# Patient Record
Sex: Female | Born: 1955 | Race: White | Hispanic: No | Marital: Married | Smoking: Never smoker
Health system: Southern US, Community
[De-identification: ages and names within clinical notes are randomized; demographics above are authoritative.]

## PROBLEM LIST (undated history)

## (undated) DIAGNOSIS — G5760 Lesion of plantar nerve, unspecified lower limb: Secondary | ICD-10-CM

## (undated) DIAGNOSIS — N840 Polyp of corpus uteri: Secondary | ICD-10-CM

## (undated) DIAGNOSIS — M8580 Other specified disorders of bone density and structure, unspecified site: Secondary | ICD-10-CM

## (undated) DIAGNOSIS — R51 Headache: Secondary | ICD-10-CM

## (undated) HISTORY — DX: Polyp of corpus uteri: N84.0

## (undated) HISTORY — DX: Headache: R51

## (undated) HISTORY — DX: Lesion of plantar nerve, unspecified lower limb: G57.60

## (undated) HISTORY — DX: Other specified disorders of bone density and structure, unspecified site: M85.80

## (undated) HISTORY — PX: HYSTEROSCOPY: SHX211

---
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
## 1999-04-12 HISTORY — PX: FOOT NEUROMA SURGERY: SHX646

## 1999-11-02 ENCOUNTER — Ambulatory Visit (HOSPITAL_BASED_OUTPATIENT_CLINIC_OR_DEPARTMENT_OTHER): Admission: RE | Admit: 1999-11-02 | Discharge: 1999-11-02 | Payer: Self-pay | Admitting: Orthopaedic Surgery

## 1999-11-02 ENCOUNTER — Encounter (INDEPENDENT_AMBULATORY_CARE_PROVIDER_SITE_OTHER): Payer: Self-pay | Admitting: *Deleted

## 2000-03-01 ENCOUNTER — Other Ambulatory Visit: Admission: RE | Admit: 2000-03-01 | Discharge: 2000-03-01 | Payer: Self-pay | Admitting: *Deleted

## 2001-05-08 ENCOUNTER — Encounter: Payer: Self-pay | Admitting: Family Medicine

## 2001-05-08 ENCOUNTER — Encounter: Admission: RE | Admit: 2001-05-08 | Discharge: 2001-05-08 | Payer: Self-pay | Admitting: Family Medicine

## 2001-11-07 ENCOUNTER — Other Ambulatory Visit: Admission: RE | Admit: 2001-11-07 | Discharge: 2001-11-07 | Payer: Self-pay | Admitting: *Deleted

## 2003-05-21 ENCOUNTER — Encounter: Admission: RE | Admit: 2003-05-21 | Discharge: 2003-05-21 | Payer: Self-pay | Admitting: *Deleted

## 2004-04-16 ENCOUNTER — Other Ambulatory Visit: Admission: RE | Admit: 2004-04-16 | Discharge: 2004-04-16 | Payer: Self-pay | Admitting: *Deleted

## 2004-06-28 ENCOUNTER — Ambulatory Visit (HOSPITAL_COMMUNITY): Admission: RE | Admit: 2004-06-28 | Discharge: 2004-06-28 | Payer: Self-pay | Admitting: Gynecology

## 2004-06-28 ENCOUNTER — Encounter (INDEPENDENT_AMBULATORY_CARE_PROVIDER_SITE_OTHER): Payer: Self-pay | Admitting: *Deleted

## 2004-06-28 ENCOUNTER — Ambulatory Visit (HOSPITAL_BASED_OUTPATIENT_CLINIC_OR_DEPARTMENT_OTHER): Admission: RE | Admit: 2004-06-28 | Discharge: 2004-06-28 | Payer: Self-pay | Admitting: Gynecology

## 2004-09-13 ENCOUNTER — Ambulatory Visit (HOSPITAL_COMMUNITY): Admission: RE | Admit: 2004-09-13 | Discharge: 2004-09-13 | Payer: Self-pay | Admitting: Endocrinology

## 2004-09-13 IMAGING — US US SOFT TISSUE HEAD/NECK
1 series · 14 of 25 positions shown · non-contrast
Comparison: none

CLINICAL DATA: 49 year old; right neck lesion.
 THYROID ULTRASOUND ? [DATE]: 
 The right lobe measures 5.0 x 2.0 x 2.2 cm.  The left lobe measures 5.3 x 1.9 x 2.0 cm.  The isthmus measures 0.8 cm.  The right thyroid lobe has two lesions and the left thyroid lobe also has two lesions.  These are all fairly similar in size and appearance.  None of these is significantly greater than a centimeter and I think these can be followed with ultrasound.  There is a palpable right submandibular lymph nodes which measures 0.9 x 0.7 x 1.3 cm.  No other enlarged neck nodes are visualized.

[Series 1: unknown · 0.09mm/px · 14 of 35 slices shown]
[im 1/35]
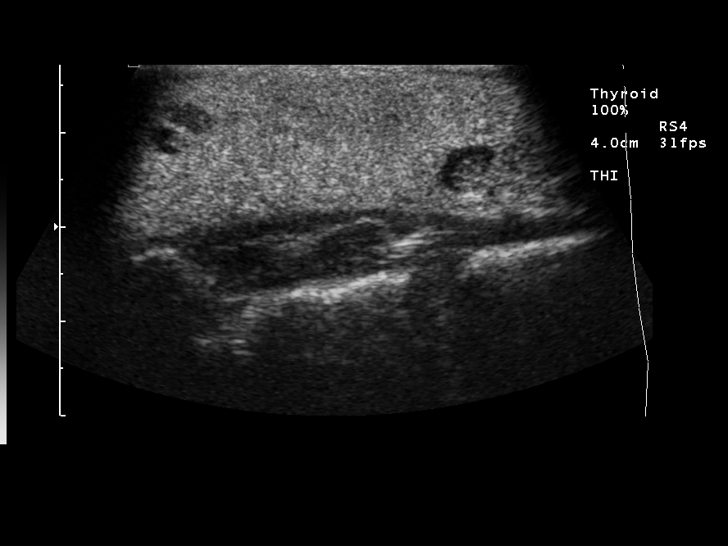
[im 3/35]
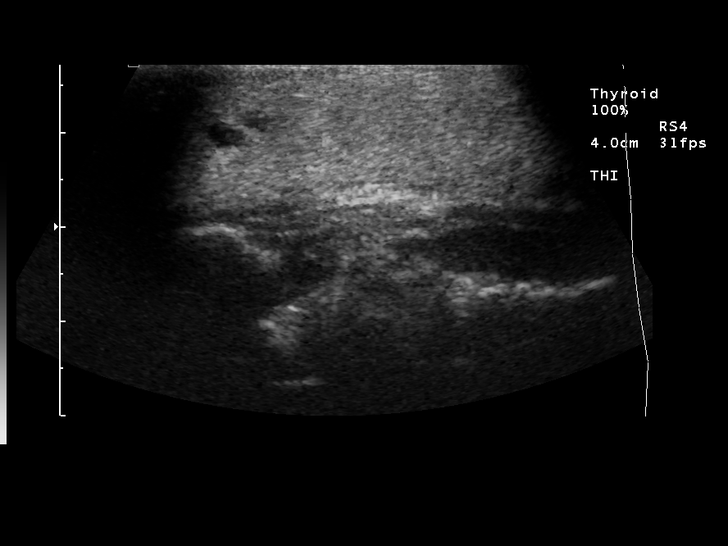
[im 6/35]
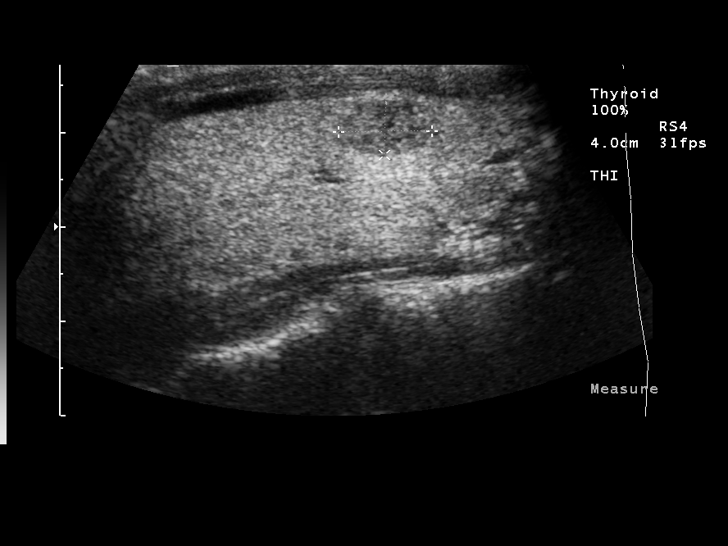
[im 9/35]
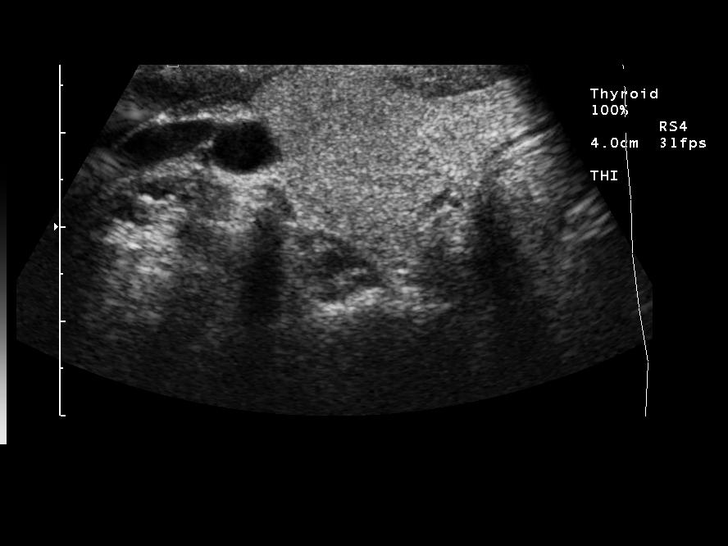
[im 12/35]
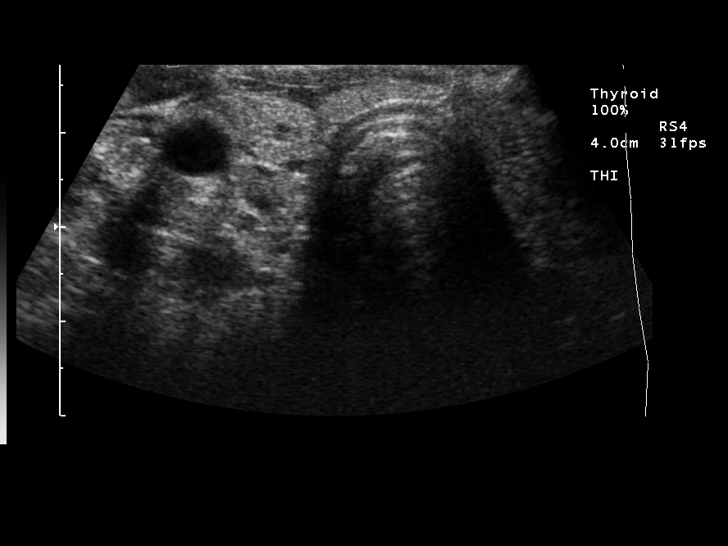
[im 13/35]
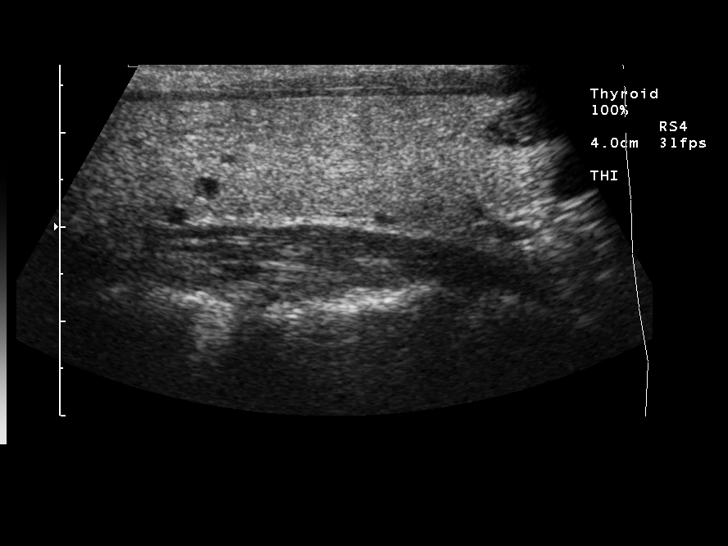
[im 16/35]
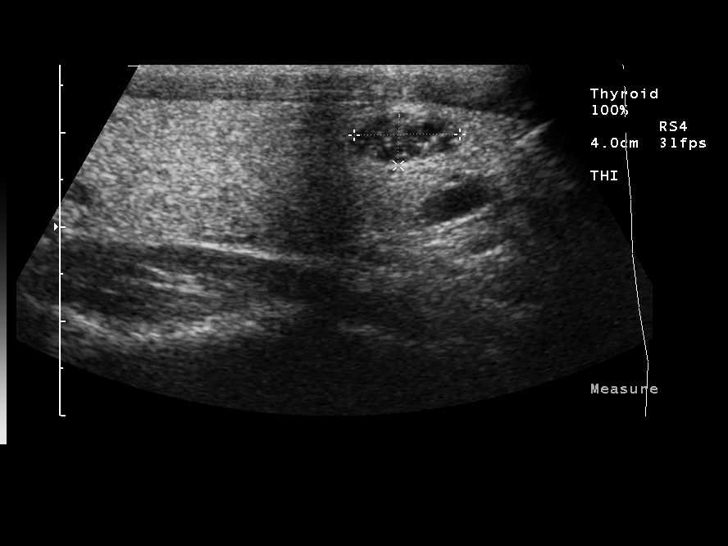
[im 19/35]
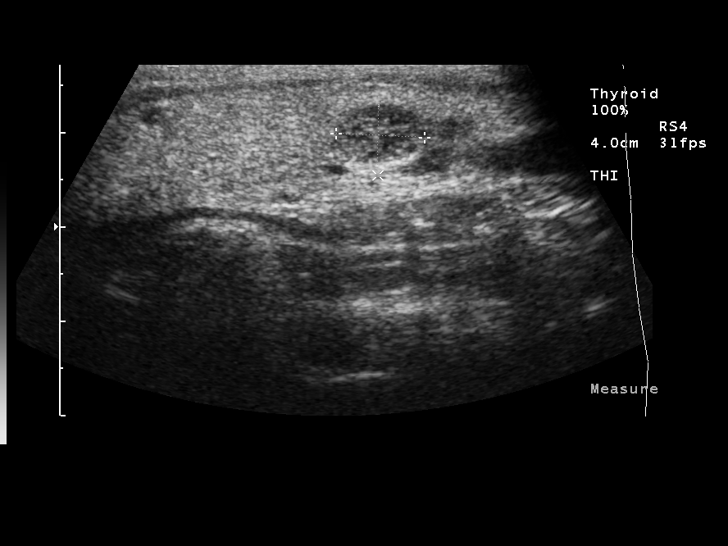
[im 22/35]
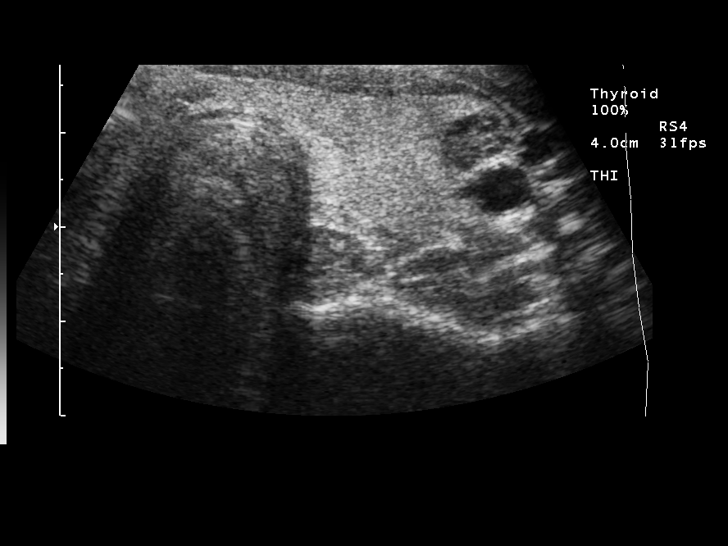
[im 23/35]
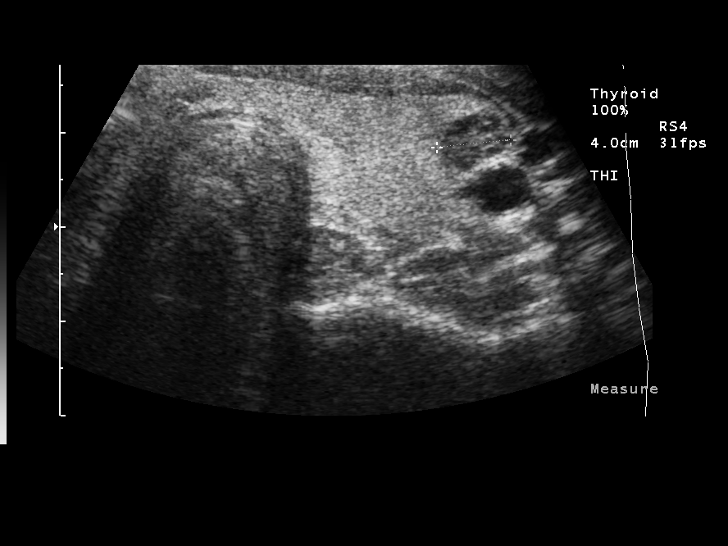
[im 26/35]
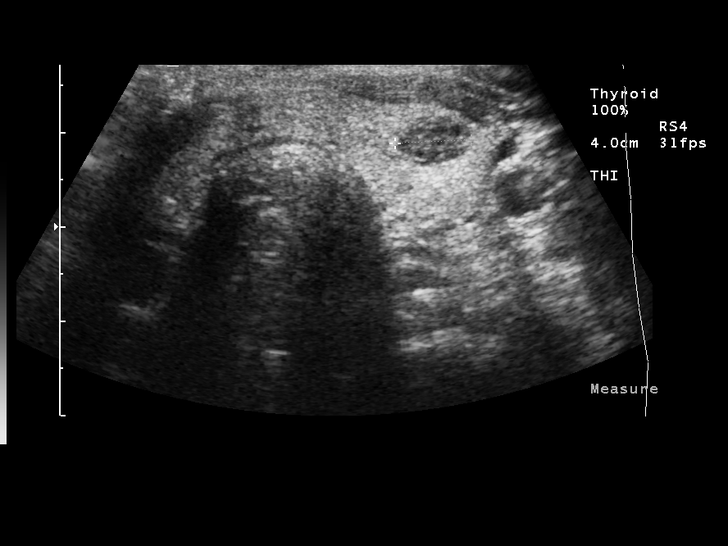
[im 29/35]
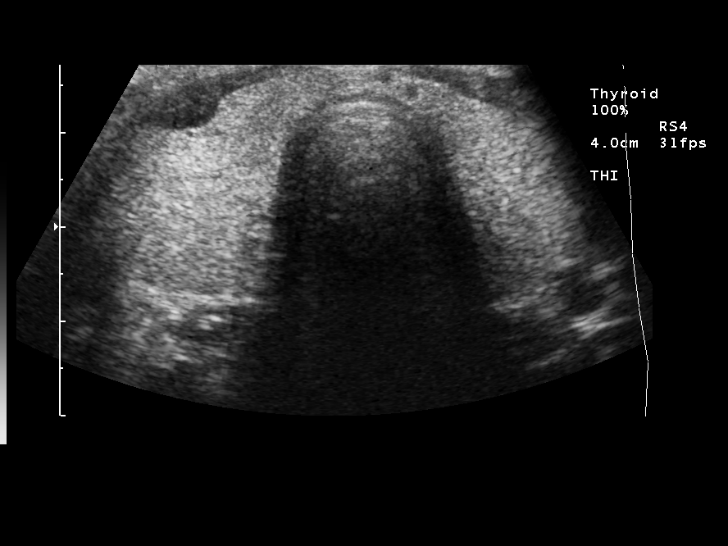
[im 32/35]
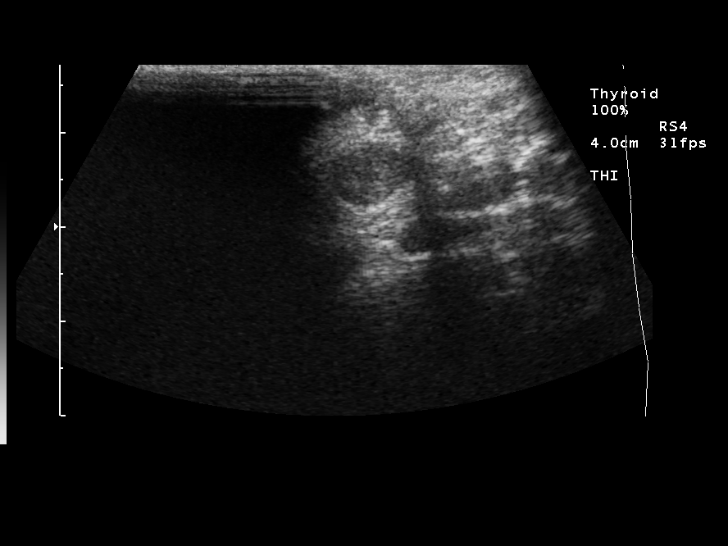
[im 35/35]
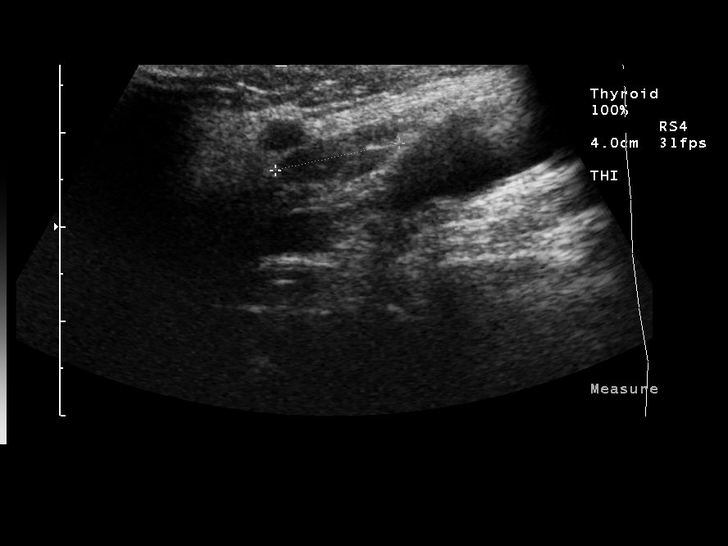

[14 of 25 positions shown; findings below may reference images not displayed]

IMPRESSION: 1.  Slight enlargement of the thyroid gland with somewhat heterogeneous echotexture.  There are four small thyroid lesions which can be followed with ultrasound in six months.
 2.  Right submandibular lymph node measuring 0.9 x 0.7 x 1.3 cm.

## 2004-11-15 ENCOUNTER — Encounter: Admission: RE | Admit: 2004-11-15 | Discharge: 2004-11-15 | Payer: Self-pay | Admitting: *Deleted

## 2004-12-14 ENCOUNTER — Encounter: Admission: RE | Admit: 2004-12-14 | Discharge: 2004-12-14 | Payer: Self-pay | Admitting: Nurse Practitioner

## 2005-06-06 ENCOUNTER — Other Ambulatory Visit: Admission: RE | Admit: 2005-06-06 | Discharge: 2005-06-06 | Payer: Self-pay | Admitting: Gynecology

## 2005-11-14 ENCOUNTER — Ambulatory Visit: Payer: Self-pay | Admitting: Gastroenterology

## 2006-01-04 ENCOUNTER — Ambulatory Visit: Payer: Self-pay | Admitting: Gastroenterology

## 2007-10-04 ENCOUNTER — Encounter: Admission: RE | Admit: 2007-10-04 | Discharge: 2007-10-04 | Payer: Self-pay | Admitting: Unknown Physician Specialty

## 2007-11-09 ENCOUNTER — Ambulatory Visit (HOSPITAL_BASED_OUTPATIENT_CLINIC_OR_DEPARTMENT_OTHER): Admission: RE | Admit: 2007-11-09 | Discharge: 2007-11-09 | Payer: Self-pay | Admitting: Gynecology

## 2007-12-11 ENCOUNTER — Encounter: Payer: Self-pay | Admitting: Gynecology

## 2007-12-11 ENCOUNTER — Ambulatory Visit: Payer: Self-pay | Admitting: Gynecology

## 2007-12-11 ENCOUNTER — Other Ambulatory Visit: Admission: RE | Admit: 2007-12-11 | Discharge: 2007-12-11 | Payer: Self-pay | Admitting: Gynecology

## 2010-05-05 ENCOUNTER — Other Ambulatory Visit: Payer: Self-pay | Admitting: Gynecology

## 2010-05-05 DIAGNOSIS — Z1239 Encounter for other screening for malignant neoplasm of breast: Secondary | ICD-10-CM

## 2010-05-18 ENCOUNTER — Ambulatory Visit
Admission: RE | Admit: 2010-05-18 | Discharge: 2010-05-18 | Disposition: A | Discharge status: Home / Self Care | Payer: BC Managed Care – PPO | Source: Ambulatory Visit | Attending: Gynecology | Admitting: Gynecology

## 2010-05-18 DIAGNOSIS — Z1239 Encounter for other screening for malignant neoplasm of breast: Secondary | ICD-10-CM

## 2010-08-24 NOTE — Op Note (Signed)
NAMEANNA-MARIE, COLLER               ACCOUNT NO.:  [id number]   MEDICAL RECORD NO.:  [id number]          PATIENT TYPE:  AMB   LOCATION:  [facility]                         FACILITY:  [facility]   PHYSICIAN:  Timothy P. Fontaine, M.D.DATE OF BIRTH:  11-Nov-1955   DATE OF PROCEDURE:  11/09/2007  DATE OF DISCHARGE:                               OPERATIVE REPORT   PREOPERATIVE DIAGNOSES:  Perimenopausal bleeding, endometrial polyp.   POSTOPERATIVE DIAGNOSES:  Perimenopausal bleeding, endometrial polyp.   PROCEDURE:  Hysteroscopy, D&C, removal of endometrial polyps.   SURGEON:  Timothy P. Fontaine, M.D.   ANESTHETIC:  General, with 1% lidocaine paracervical block.   ESTIMATED BLOOD LOSS:  Minimal.   SORBITOL DISCREPANCY:  Minimal.   COMPLICATIONS:  None.   SPECIMENS:  1. Endometrial curetting  2. Questionable endometrial polyp.   FINDINGS:  EUA:  External BUS and vagina normal.  Cervix normal.  Uterus  normal size, midline and mobile.  Adnexa without masses.  Hysteroscopic  with multiple polypoidal fragments filling the cavity.  Post D&C, cavity  noted to be empty, good distention, adequate hysteroscopy, noting fundus  anterior and posterior surfaces, lower uterine segment, endocervical  canal, right and left tubal ostia all visualized.   PROCEDURE:  The patient was taken to the operating room, underwent  general anesthesia, and was placed in the low dorsal lithotomy position.  Received a perineal vaginal preparation with Betadine solution.  Bladder  emptied with in-and-out Foley catheterization.  EUA performed, and the  patient was draped in the usual fashion.  Cervix was visualized with a  speculum.  Anterior lip grasped with a single-tooth tenaculum, and a  paracervical block using 1% lidocaine was placed, a total of 9 mL.  The  cervix was gently dilated during this process.  With removal of the  dilator, a polypoid old mucoid mass extruded from the cervix, and this  was sent as a  questionable polyp, although could represent a clot.  Subsequently, hysteroscopy was performed, with findings of multiple  polypoidal hyperplastic-type pattern filling the cavity.  A sharp  curettage was performed, with abundant return, and re-hysteroscopy  showed an empty cavity.  No remaining fragments.  There was good  distention, no evidence of  perforation.  Instruments were removed.  Adequate hemostasis visualized.  The patient received Toradol intraoperatively and was placed in the  supine position, awakened without difficulty, and was taken to the  recovery room in good condition, having tolerated the procedure well.      Timothy P. Fontaine, M.D.  Electronically Signed     TPF/MEDQ  D:  11/09/2007  T:  11/09/2007  Job:  [id number]

## 2010-08-24 NOTE — H&P (Signed)
NAME:  Emily Mcknight, Emily Mcknight               ACCOUNT NO.:  [id number]   MEDICAL RECORD NO.:  [id number]         PATIENT TYPE:  HAMB   LOCATION:                               FACILITY:  [facility]   PHYSICIAN:  Timothy P. Fontaine, M.D.DATE OF BIRTH:  1955/12/09   DATE OF ADMISSION:  11/09/2007  DATE OF DISCHARGE:                              HISTORY & PHYSICAL   CHIEF COMPLAINT:  Perimenopausal bleeding, endometrial polyp.   HISTORY OF PRESENT ILLNESS:  A 55 year old G2, P2 female, vasectomy  birth control, with a history of irregular bleeding since the beginning  of this year.  The patient does have past history of endometrial polyp  status post hysteroscopy, polypectomy in 2006 and her evaluation  currently included sonohysterogram which showed an endometrial polyp  measuring 22 x 27 x 10 mm along the anterior endometrial cavity wall.  The patient is admitted at this time for hysteroscopy, polypectomy, D  and C.   PAST MEDICAL HISTORY:  Headaches.   PAST SURGICAL HISTORY:  Hysteroscopy, D and C, thyroid cyst removal,  foot neuroma x2 excision.   CURRENT MEDICATIONS:  1. Topamax.  2. Relpax p.r.n. for headaches.   ALLERGIES:  No medications.   REVIEW OF SYSTEMS:  Noncontributory.   FAMILY HISTORY:  Noncontributory.   SOCIAL HISTORY:  Noncontributory.   PHYSICAL EXAM:  VITAL SIGNS:  Afebrile, vital signs are stable.  HEENT:  Normal.  LUNGS:  Clear.  CARDIAC:  Regular rate.  No rubs, murmurs or gallops.  ABDOMEN:  Exam benign.  PELVIC:  External BUS, vagina normal.  Cervix grossly normal.  Uterus  normal size, midline and mobile, nontender.  Adnexa without masses or  tenderness.   ASSESSMENT:  A 55 year old G2, P2 female, vasectomy birth control, with  recurrent endometrial polyps, irregular bleeding for hysteroscopy, D and  C and removal of polyps.  I reviewed with the patient what is involved  with the procedure, the use of the hysteroscope, resectoscope, D and C  and the risks  to include the risk of infection, bleeding, transfusion,  uterine perforation, damage to internal organs including bowel, bladder,  ureters, vessels and nerves necessitating major exploratory reparative  surgeries, future reparative surgeries, bowel resection, bladder repair,  ostomy formation all discussed, understood and accepted.  The risks of  distention media absorption, metabolic complications, seizures was all  discussed, understood and accepted.  The patient's questions were  answered.  She is ready to proceed with surgery.  She was instructed to  use Cytotec 200 mcg tablet the evening before vaginally to help with  cervical dilatation.  She has already received a prescription for  Darvocet-N 100 #20 one to two p.o. q.4-6 hours p.r.n. pain for  postoperative pain relief.      Timothy P. Fontaine, M.D.  Electronically Signed     TPF/MEDQ  D:  11/07/2007  T:  11/07/2007  Job:  [id number]

## 2010-08-27 NOTE — H&P (Signed)
NAMESHANIKWA, STATE               ACCOUNT NO.:  [id number]   MEDICAL RECORD NO.:  [id number]           PATIENT TYPE:   LOCATION:                                 FACILITY:   PHYSICIAN:  Timothy P. Fontaine, M.D.   DATE OF BIRTH:   DATE OF ADMISSION:  DATE OF DISCHARGE:                                HISTORY & PHYSICAL   Patient is being admitted to [facility], March 20, 9:00 a.m.  for surgery.   CHIEF COMPLAINT:  Irregular bleeding.   HISTORY OF PRESENT ILLNESS:  This 55 year old G2, P2 female with vasectomy  birth control who initially presented complaining of irregular bleeding.  Patient notes that her menses are regular, but she has bleeding in-between  with spotting as well, and bleeding after intercourse.  Ultrasound with  sonohystogram was performed which showed an endometrial polyp measuring 17 x  9 x 11 mm, and the patient is admitted at this time for hysteroscopic  evaluation, polypectomy and D&C.   PAST MEDICAL HISTORY:  Includes headaches.   PAST SURGICAL HISTORY:  Includes thyroid cyst excision at age 79 and a foot  surgery x 2 for neuromas.   CURRENT MEDICATIONS:  Include Topamax daily and Relpax p.r.n.   ALLERGIES:  Include no medications.   REVIEW OF SYSTEMS:  Noncontributory.   FAMILY HISTORY:  Noncontributory.   SOCIAL HISTORY:  Noncontributory.   ADMISSION PHYSICAL EXAMINATION:  VITAL SIGNS:  Afebrile, vital signs are  stable.  HEENT:  Normal.  LUNGS:  Clear.  CARDIAC:  Regular rate without rubs, murmurs or gallops.  ABDOMEN:  Exam is benign.  PELVIC:  External BUS vagina within normal limits.  Cervix grossly normal.  Uterus normal size, mL, mobile, nontender.  Adnexa without masses or  tenderness.   ASSESSMENT:  A 55 year old G2, P2 female with vasectomy birth control with  history of bleeding in-between periods and postcoital.  Ultrasound with  sonohystogram suggestive of an endometrial polyp.  Patient is admitted at  this time  for hysteroscopy, polyp resection, D&C.  I reviewed with the  patient in detail what is involved with the procedure.  To include the  expected intraoperative and postoperative courses.  The use of the  resectoscope and D&C were reviewed, and the risks of bleeding, transfusion,  infection, uterine perforation, damage to internal organs including bowel,  bladder, ureters, vessels and nerves necessitating major exploratory  reparative surgeries and future  reparative surgeries were all discussed, understood and accepted.  She also  understands that if uterine perforation occurs during the case that at this  point the procedure would be terminated and we would have to reschedule this  at a future date.  The patient's questions are answered to her satisfaction.  She is ready to proceed with surgery.      TPF/MEDQ  D:  06/27/2004  T:  06/27/2004  Job:  [id number]

## 2010-08-27 NOTE — Op Note (Signed)
NAMESYMPHONI, Emily Mcknight               ACCOUNT NO.:  [id number]   MEDICAL RECORD NO.:  [id number]          PATIENT TYPE:  AMB   LOCATION:  [facility]                         FACILITY:  [facility]   PHYSICIAN:  Timothy P. Fontaine, M.D.DATE OF BIRTH:  May 13, 1955   DATE OF PROCEDURE:  06/28/2004  DATE OF DISCHARGE:                                 OPERATIVE REPORT   PREOPERATIVE DIAGNOSIS:  Endometrial polyp.   POSTOPERATIVE DIAGNOSIS:  Endometrial polyp.   PROCEDURE PERFORMED:  Hysteroscopic endometrial polypectomy, dilation and  curettage.   SURGEON:  Timothy P. Fontaine, M.D.   ANESTHESIA:  General.   COMPLICATIONS:  None.   ESTIMATED BLOOD LOSS:  Minimal.   SORBITOL DISCREPANCY:  Approximately 100 cc.   SPECIMENS:  1.  Endometrial polyp.  2.  Endometrial curettings.   FINDINGS:  Examination under anesthesia:  External BUS, vagina normal.  Cervix grossly normal.  Uterus normal size, midline, and mobile.  Adnexa  without gross masses.  Hysteroscopic:  Adequate, noting right and left tubal ostia, fundus,  anterior/posterior uterine surfaces, lower uterine segment and endocervical  canal, all visualized.  Single finger-like endometrial polyp from the  midanterior fundal region excised in its entirety.   DESCRIPTION OF PROCEDURE:  The patient was taken to the operating room,  underwent general anesthesia and was placed in dorsal lithotomy position,  received a perineal vaginal preparation with Betadine solution.  Bladder  emptied with in and out Foley catheterization.  Examination under anesthesia  performed and the patient was draped in the usual fashion.  The cervix was  visualized with a weighted speculum.  Anterior lip grasped with a single  tooth tenaculum and the cervix was gently gradually dilated to admit the  operative hysteroscope.  Hysteroscopy was performed with findings noted  above.  Using the right angle resectoscopic loop, the polyp was excised in  its entirety at the  level of the surrounding endometrium.  A sharp curettage  was then performed.  Specimen sent separately.  Rehysteroscopy showed an empty cavity, good distension, no evidence of  perforation.  The instruments were removed.  Adequate hemostasis visualized.  Patient placed in supine position, awakened without difficulty and taken to  recovery room in good condition having tolerated the procedure well.      TPF/MEDQ  D:  06/28/2004  T:  06/28/2004  Job:  [id number]

## 2010-08-27 NOTE — Op Note (Signed)
[place]  Patient:    Emily Mcknight, Emily Mcknight                        MRN: [id number] Proc. Date: 11/02/99 Attending:  Lubertha Basque. Jerl Santos, M.D.                           Operative Report  PREOPERATIVE DIAGNOSIS:  Left foot neuroma.  POSTOPERATIVE DIAGNOSIS:  Left foot neuroma.  PROCEDURE:  Excision left foot neuroma.  ANESTHESIA:  General.  SURGEON:  Lubertha Basque. Jerl Santos, M.D.  ASSISTANT:  Prince Rome, P.A.  INDICATIONS FOR PROCEDURE:  The patient is 55 year old woman with a long history of left foot pain.  She also has a very complicated right foot history which required neuroma excision times three.  She has had no surgery on the left foot but has had an interspace injection in the third interspace which relieved her symptoms for some time.  With recurrent pain she was offered an excision.  The procedure was discussed with the patient and informed operative consent was obtained after discussion of possible complications of reaction to anesthesia, infection, and continued pain.  DESCRIPTION OF PROCEDURE:  The patient was taken to the operating suite where general anesthetic was applied with LMA without difficulty.  She was positioned supine and prepped and draped in the normal sterile fashion.  After administration of preop IV antibiotics, the left leg was elevated, exsanguinated, and a tourniquet inflated about the calf.  An incision was made in the third interspace dorsally with dissection down to the metatarsal heads.  A laminar spreader was placed between metatarsal heads three and four to expose the third interspace.  The intermetatarsal ligament was incised and a thickened nerve was visualized.  This was excised with the extensions that went to toes three and four.  Excision was taken proximal to the metatarsal heads.  The tourniquet was then deflated and a small amount of bleeding was easily controlled with Bovie cautery.  The toes  became pink and warm immediately.  The wound was irrigated, followed by closure of skin with vertical mattress sutures of nylon.  Adaptic was placed on the wound followed by dry gauze and a loose Ace wrap.  Estimated blood loss and intraoperative fluids:  Obtain from anesthesia records.  DISPOSITION:  The patient was extubated in the operating room and taken to the recovery room in stable condition.  Plans were for her to go  home the same day and follow up in the office in less than a week.  I will contact her by phone tonight. DD:  11/02/99 TD:  11/03/99 Job: [id number]

## 2010-10-22 ENCOUNTER — Ambulatory Visit (INDEPENDENT_AMBULATORY_CARE_PROVIDER_SITE_OTHER): Payer: BC Managed Care – PPO | Admitting: Gynecology

## 2010-10-22 DIAGNOSIS — N952 Postmenopausal atrophic vaginitis: Secondary | ICD-10-CM

## 2010-10-22 DIAGNOSIS — B373 Candidiasis of vulva and vagina: Secondary | ICD-10-CM

## 2010-10-22 DIAGNOSIS — N898 Other specified noninflammatory disorders of vagina: Secondary | ICD-10-CM

## 2010-10-26 ENCOUNTER — Encounter: Payer: Self-pay | Admitting: *Deleted

## 2011-01-26 ENCOUNTER — Other Ambulatory Visit: Payer: Self-pay | Admitting: Gynecology

## 2011-02-25 ENCOUNTER — Ambulatory Visit (INDEPENDENT_AMBULATORY_CARE_PROVIDER_SITE_OTHER): Payer: BC Managed Care – PPO | Admitting: Gynecology

## 2011-02-25 ENCOUNTER — Encounter: Payer: Self-pay | Admitting: Gynecology

## 2011-02-25 VITALS — BP 110/60

## 2011-02-25 DIAGNOSIS — L292 Pruritus vulvae: Secondary | ICD-10-CM

## 2011-02-25 DIAGNOSIS — L293 Anogenital pruritus, unspecified: Secondary | ICD-10-CM

## 2011-02-25 DIAGNOSIS — B373 Candidiasis of vulva and vagina: Secondary | ICD-10-CM

## 2011-02-25 DIAGNOSIS — N898 Other specified noninflammatory disorders of vagina: Secondary | ICD-10-CM

## 2011-02-25 MED ORDER — TERCONAZOLE 0.4 % VA CREA: 1.0000 | TOPICAL_CREAM | Freq: Every day | VAGINAL | Status: AC

## 2011-02-25 NOTE — Progress Notes (Signed)
Patient presents with a 3 month history of on and off vaginal pruritus. Was treated for yeast of the summer said that he transiently got better but she's been having irritation since then.  Exam Pelvic external with diffuse vulvar periclitoral inflammatory changes mild in nature no specific lesions. Vaginal exam White discharge. Cervix normal. Uterus normal size midline mobile nontender adnexa without masses or tenderness  Assessment and plan: Candida vulvovaginitis.Wet prep is positive for yeast.  We'll treat with Terazol 7 cream internal/external, follow up if symptoms persist or recur.

## 2011-03-21 ENCOUNTER — Telehealth: Payer: Self-pay | Admitting: *Deleted

## 2011-03-21 MED ORDER — FLUCONAZOLE 200 MG PO TABS: 200.0000 mg | ORAL_TABLET | Freq: Every day | ORAL | Status: AC

## 2011-03-21 NOTE — Telephone Encounter (Signed)
I would suggest Diflucan 200 mg daily x7 days, if persists then office visit.

## 2011-03-21 NOTE — Telephone Encounter (Signed)
Pt informed with the below note. 

## 2011-03-21 NOTE — Telephone Encounter (Signed)
Pt calling c/o yeast infection still, pt was last seen on 02/25/11 and given Terazol 7 day cream and irratation still there. Pt wants to know if she should have OV or another rx? Please advise

## 2011-03-30 ENCOUNTER — Encounter: Payer: Self-pay | Admitting: Gynecology

## 2011-03-30 ENCOUNTER — Ambulatory Visit (INDEPENDENT_AMBULATORY_CARE_PROVIDER_SITE_OTHER): Payer: BC Managed Care – PPO | Admitting: Gynecology

## 2011-03-30 DIAGNOSIS — L293 Anogenital pruritus, unspecified: Secondary | ICD-10-CM

## 2011-03-30 DIAGNOSIS — B373 Candidiasis of vulva and vagina: Secondary | ICD-10-CM

## 2011-03-30 DIAGNOSIS — N898 Other specified noninflammatory disorders of vagina: Secondary | ICD-10-CM

## 2011-03-30 DIAGNOSIS — IMO0002 Reserved for concepts with insufficient information to code with codable children: Secondary | ICD-10-CM

## 2011-03-30 DIAGNOSIS — N952 Postmenopausal atrophic vaginitis: Secondary | ICD-10-CM

## 2011-03-30 MED ORDER — ESTRADIOL 0.1 MG/GM VA CREA: 2.0000 g | TOPICAL_CREAM | Freq: Every day | VAGINAL | Status: DC

## 2011-03-30 MED ORDER — FLUCONAZOLE 200 MG PO TABS: 200.0000 mg | ORAL_TABLET | Freq: Every day | ORAL | Status: AC

## 2011-03-30 NOTE — Patient Instructions (Signed)
Use estrogen cream topically for 30 days and call me in follow up to see how you're doing. Take Diflucan daily for 7 days call me if her symptoms persist.

## 2011-03-30 NOTE — Progress Notes (Signed)
Patient presents with 2 issues. She was treated with Diflucan 200 mg daily for 7 days due to persistent yeast infection despite Terazol cream. Her symptoms seem better, but not totally gone. The second issue is she feels that her clitoral hood is irritated and adherent. She said before the area was really raw when she had the external yeast and she treated with the cream that seemed to get better but now it just seems more atrophic and does not protrude as much as it use to.  It is bothersome enough that she does not have intercourse anymore because it is uncomfortable.  Exam chaperone External with normal-appearing clitoris although the clitoral body is not as prominent, the clitoral hood was retracted and otherwise appeared to be normal, there were no lesions, irritative changes or other abnormalities.  Mild atrophic changes noted.  BUS vagina with whitish discharge KOH wet prep done. Bimanual without masses or tenderness  Assessment and plan: 1. White discharge. KOH wet prep is positive for yeast. We'll treat with another seven-day course of Diflucan 200 daily to see if we can totally eradicated as it does seem to be getting better but not totally gone. 2. Atrophic clitoral changes. The patient describes almost a phymotic change. I suggested we try Estrace vaginal cream to apply periclitorally for 30 days and see how she does with this. I reviewed with her the absorption issue and I do not want her to use prolonged unopposed estrogen cream without progesterone for uterine protection but I think for short course to see if this doesn't help alleviate her symptoms. If she needs longer term treatment then we'll rediscuss options. I did review the WHI study with HRT to include stroke, heart attack, DVT as well as possible breast cancer risk. Patient will call me within 30 days in response either wait to see how she's doing.

## 2011-04-13 ENCOUNTER — Other Ambulatory Visit: Payer: Self-pay | Admitting: Gynecology

## 2011-04-13 NOTE — Telephone Encounter (Signed)
Telephone call- states did get fair relief after Diflucan 200 for 7 days. After one week of not taking Diflucan irritation is returning. Minimal discharge, no odor but more of the vaginal irritation. Will try Terazol 7 one applicator at bedtime x7. If no relief instructed to call. Will try a small amount of estradiol cream externally to the vaginal opening to see if that would help with vaginal irritation if yeast symptoms are resolved after Terazol. States the estradiol cream has helped with the clitoral hood irritation.

## 2011-04-13 NOTE — Telephone Encounter (Signed)
Emily Mcknight, I called patient, will try Terazol 7 one applicator at bedtime x7   Instead of Diflucan 200 please call in a prescription.

## 2011-04-15 ENCOUNTER — Encounter: Payer: Self-pay | Admitting: *Deleted

## 2011-04-15 ENCOUNTER — Other Ambulatory Visit: Payer: Self-pay | Admitting: *Deleted

## 2011-04-15 MED ORDER — TERCONAZOLE 0.4 % VA CREA: TOPICAL_CREAM | VAGINAL | Status: DC

## 2011-04-15 NOTE — Progress Notes (Signed)
Pt called stating her pharmacy never got her rx for Terazol cream 7 day cream. rx was never sent by provider and not routed to staff to send medication. Pt informed rx sent to pharmacy.

## 2011-05-04 ENCOUNTER — Telehealth: Payer: Self-pay | Admitting: *Deleted

## 2011-05-04 MED ORDER — FLUCONAZOLE 200 MG PO TABS: 200.0000 mg | ORAL_TABLET | Freq: Every day | ORAL | Status: AC

## 2011-05-04 NOTE — Telephone Encounter (Signed)
We'll go with Diflucan 200 mg x14 days. If she does have a recurrence then she will need an office visit.

## 2011-05-04 NOTE — Telephone Encounter (Signed)
Pt informed with the below note. 

## 2011-05-04 NOTE — Telephone Encounter (Signed)
Pt was seen 03/30/11 for recurrent yeast infections. Pt giving rx for diflucan 200 mg daily for 7 days. Irritation, slight discharge is back again. Pt wanted to know if she could do a 2 week treatment rather than 1 week treatment to see how that works, rather cream of pills. Please advise

## 2011-06-17 ENCOUNTER — Encounter: Payer: Self-pay | Admitting: Gynecology

## 2011-06-17 ENCOUNTER — Ambulatory Visit (INDEPENDENT_AMBULATORY_CARE_PROVIDER_SITE_OTHER): Payer: BC Managed Care – PPO | Admitting: Gynecology

## 2011-06-17 DIAGNOSIS — N898 Other specified noninflammatory disorders of vagina: Secondary | ICD-10-CM

## 2011-06-17 DIAGNOSIS — N760 Acute vaginitis: Secondary | ICD-10-CM

## 2011-06-17 DIAGNOSIS — N952 Postmenopausal atrophic vaginitis: Secondary | ICD-10-CM

## 2011-06-17 DIAGNOSIS — B9689 Other specified bacterial agents as the cause of diseases classified elsewhere: Secondary | ICD-10-CM

## 2011-06-17 DIAGNOSIS — A499 Bacterial infection, unspecified: Secondary | ICD-10-CM

## 2011-06-17 LAB — WET PREP FOR TRICH, YEAST, CLUE
Clue Cells Wet Prep HPF POC: NONE SEEN
Trich, Wet Prep: NONE SEEN

## 2011-06-17 MED ORDER — CLINDAMYCIN PHOSPHATE 2 % VA CREA: 1.0000 | TOPICAL_CREAM | Freq: Every day | VAGINAL | Status: AC

## 2011-06-17 MED ORDER — ESTRADIOL 0.1 MG/GM VA CREA: TOPICAL_CREAM | VAGINAL | Status: DC

## 2011-06-17 NOTE — Patient Instructions (Signed)
Use antibiotic cream as prescribed. Apply estrogen cream around the clitoris. Report any vaginal bleeding. Follow up for ultrasound in 6 months.

## 2011-06-17 NOTE — Progress Notes (Signed)
Patient presents with several week history of vaginal discharge and irritation. She has a history of recurrent yeast infections have been using over-the-counter medications but does not seem to help. She's also been applying estrogen to her periclitoral region due to some clitoral hood adhesions which is irritating to her that it seems somewhat better. She's not having other menopausal symptoms such as hot flashes night sweats or vaginal dryness/dyspareunia.  Exam with Amy chaperone present External BUS vagina with whitish discharge. Clitoris is able to be visualized from the clitoral hood but is somewhat restrictive. Cervix normal. Uterus normal size midline mobile nontender. Adnexa without masses or tenderness  Assessment and plan: 1. Vaginal discharge.  Wet prep is consistent with BV.  We'll treat with Cleocin vaginal cream nightly x1 week. Follow up if symptoms persist or recur. 2. Clitoral hood phimosis. Patient says it's improved but not back to where she would like it. Options for management were discussed. Full HRT to include progesterone/estrogen replacement versus trial of Vagifem or vaginal estrogen cream with progesterone replacement. Patient wants to continue with the estrogen cream periclitorally. I suggested that she become a little more aggressive to reduce the clitoral hood manually.  I did discuss absorption issue with her as I had in the past and the risk of endometrial stimulation, hyperplasia and cancer reviewed as well as the issues of stroke heart attack DVT and breast cancer as outlined in the WHI study discussed. As she is just applying it. Clearly I think is reasonable to monitor, report any bleeding and have asked her to come back in 6 months for an ultrasound of the endometrial echo and then go from there. It appears more prominent than consider sampling. Patient agrees with the plan and knows the importance of follow up.

## 2011-09-06 ENCOUNTER — Ambulatory Visit (INDEPENDENT_AMBULATORY_CARE_PROVIDER_SITE_OTHER): Payer: BC Managed Care – PPO | Admitting: Gynecology

## 2011-09-06 ENCOUNTER — Encounter: Payer: Self-pay | Admitting: Gynecology

## 2011-09-06 DIAGNOSIS — N898 Other specified noninflammatory disorders of vagina: Secondary | ICD-10-CM

## 2011-09-06 DIAGNOSIS — B373 Candidiasis of vulva and vagina: Secondary | ICD-10-CM

## 2011-09-06 LAB — WET PREP FOR TRICH, YEAST, CLUE: Trich, Wet Prep: NONE SEEN

## 2011-09-06 MED ORDER — PROGESTERONE MICRONIZED 100 MG PO CAPS: 100.0000 mg | ORAL_CAPSULE | Freq: Every day | ORAL | Status: DC

## 2011-09-06 MED ORDER — TERCONAZOLE 0.4 % VA CREA: 1.0000 | TOPICAL_CREAM | Freq: Every day | VAGINAL | Status: DC

## 2011-09-06 NOTE — Progress Notes (Signed)
Patient presents with several issues. 1. Over the last week or so she's noted some vaginal bumps around the opening to the vagina both at the posterior fourchette and anteriorly. Nonpainful. Never noticed these before. 2. Continued issues with periclitoral phimosis and decreased sensation with intercourse. She has been using Estrace cream although somewhat sparingly around the clitoral hood and does not notice a significant improvement.  Exams Sherrilyn Rist chaperone present External/BUS with small submucosal 2-3 mm cyst inferior to urethral opening. No other abnormalities noted particularly around the posterior fourchette where she was pointing to.  Vagina with white discharge. Cervix normal. Uterus normal sized mobile nontender. Adnexa without masses or tenderness.  Assessment and plan: 1. Small suburethral opening submucosal cyst. No other abnormalities on inspection. Recommended observation at present. She'll represent in a month or so for her annual exam and we'll look at this area also. If it persists options for excision discussed. If it seems to enlarge prior to her scheduled appointment she'll represent. 2. White discharge. Positive for yeast. We'll treat with Terazol 7 day cream nightly. 3. Periclitoral changes. Exam shows some mild phimosis. Her main complaint is decreased sensation. Options to include full HRT versus more aggressive vaginal HRT versus trial of testosterone cream periclitorally reviewed. Patient wants to try Estrace vaginal cream nightly to see if a more aggressive vaginal estrogen helps. I again discussed with her the issues of absorption, WHI study with increased risks of stroke heart attack DVT possible breast cancer risk as outlined in previous discussions. Recommended progesterone withdrawal intermittently and I prescribed Prometrium 100 mg first 12 days of the month to start in July and she will see me back after this for her annual exam. She does have a withdrawal bleed then we'll  plan every month to every other month withdrawal versus a continual nightly dose and we'll rediscuss seeing what she does with the initial progesterone challenge.

## 2011-09-06 NOTE — Patient Instructions (Signed)
Increase with daily intravaginal estrogen cream. Start progesterone in July as discussed. Follow up for annual exam in July.

## 2011-09-08 ENCOUNTER — Other Ambulatory Visit: Payer: Self-pay | Admitting: *Deleted

## 2011-09-08 MED ORDER — ESTRADIOL 0.1 MG/GM VA CREA: 2.0000 g | TOPICAL_CREAM | Freq: Every day | VAGINAL | Status: AC

## 2011-09-08 NOTE — Telephone Encounter (Signed)
rx called in

## 2011-09-27 ENCOUNTER — Other Ambulatory Visit: Payer: Self-pay | Admitting: Gynecology

## 2011-10-05 ENCOUNTER — Other Ambulatory Visit: Payer: Self-pay | Admitting: Gynecology

## 2011-10-05 DIAGNOSIS — Z1231 Encounter for screening mammogram for malignant neoplasm of breast: Secondary | ICD-10-CM

## 2011-10-19 ENCOUNTER — Ambulatory Visit
Admission: RE | Admit: 2011-10-19 | Discharge: 2011-10-19 | Disposition: A | Discharge status: Home / Self Care | Payer: BC Managed Care – PPO | Source: Ambulatory Visit | Attending: Gynecology | Admitting: Gynecology

## 2011-10-19 DIAGNOSIS — Z1231 Encounter for screening mammogram for malignant neoplasm of breast: Secondary | ICD-10-CM

## 2011-10-24 ENCOUNTER — Encounter: Payer: Self-pay | Admitting: Gynecology

## 2011-10-24 ENCOUNTER — Ambulatory Visit (INDEPENDENT_AMBULATORY_CARE_PROVIDER_SITE_OTHER): Payer: BC Managed Care – PPO | Admitting: Gynecology

## 2011-10-24 VITALS — BP 110/66 | Ht 70.5 in | Wt 131.0 lb

## 2011-10-24 DIAGNOSIS — Z01419 Encounter for gynecological examination (general) (routine) without abnormal findings: Secondary | ICD-10-CM

## 2011-10-24 DIAGNOSIS — N952 Postmenopausal atrophic vaginitis: Secondary | ICD-10-CM

## 2011-10-24 DIAGNOSIS — N898 Other specified noninflammatory disorders of vagina: Secondary | ICD-10-CM

## 2011-10-24 LAB — WET PREP FOR TRICH, YEAST, CLUE
Trich, Wet Prep: NONE SEEN
Yeast Wet Prep HPF POC: NONE SEEN

## 2011-10-24 MED ORDER — FLUCONAZOLE 150 MG PO TABS: 150.0000 mg | ORAL_TABLET | Freq: Once | ORAL | Status: AC

## 2011-10-24 MED ORDER — PROGESTERONE MICRONIZED 100 MG PO CAPS: 100.0000 mg | ORAL_CAPSULE | Freq: Every day | ORAL | Status: DC

## 2011-10-24 MED ORDER — ESTRADIOL 0.05 MG/24HR TD PTTW: 1.0000 | MEDICATED_PATCH | TRANSDERMAL | Status: DC

## 2011-10-24 NOTE — Progress Notes (Signed)
Emily Mcknight 01/26/1956 [id number]        56 y.o.  G2P2002 for annual exam.  Several issues noted below.  Past medical history,surgical history, medications, allergies, family history and social history were all reviewed and documented in the EPIC chart. ROS:  Was performed and pertinent positives and negatives are included in the history.  Exam: Kim assistant Filed Vitals:   10/24/11 0904  BP: 110/66  Height: 5' 10.5" (1.791 m)  Weight: 131 lb (59.421 kg)   General appearance  Normal Skin grossly normal Head/Neck normal with no cervical or supraclavicular adenopathy thyroid normal Lungs  clear Cardiac RR, without RMG Abdominal  soft, nontender, without masses, organomegaly or hernia Breasts  examined lying and sitting without masses, retractions, discharge or axillary adenopathy. Pelvic  Ext/BUS/vagina  normal with general atrophic genital changes and periclitoral atrophy/phimosis  Cervix  normal   Uterus  anteverted, normal size, shape and contour, midline and mobile nontender   Adnexa  Without masses or tenderness    Anus and perineum  normal   Rectovaginal  normal sphincter tone without palpated masses or tenderness.    Assessment/Plan:  56 y.o. G69P2002 female for annual exam.   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 We've most recently had started a Prometrium withdrawal in July where she did have a light withdrawal bleed. The plan was to have a progesterone withdrawal intermittently. She's also been using vaginal yeast cream intermittently for irritation that she suspects is yeast.  Wet prep today is negative for yeast. After a lengthy discussion my recommendation would be to switch to systemic HRT. I'm not sure whether using the vaginal yeast cream and then switching to the estrogen cream is aggressively treating her vaginal atrophy. We've also discussed testosterone cream in the past and again today from a  sexual standpoint but I think given the general vaginal atrophy that she would benefit more from a systemic approach. She has noticed improvement sexually since starting the Estrace vaginal cream.  I reviewed the WHI study and risks of stroke, heart attack, DVT as well as increased risk of breast cancer all reviewed and she understands and accepts this we'll plan on a mini Vivelle 0.05 patch and Prometrium 100 mg nightly. I also suggested Diflucan 150 mg weekly x 2-3 months as a suppressive dose just to see if we can't address both issues at the same time. Patient will follow up with me regardless in 3 months and we'll see how she's doing. 2. Mammography.  Patient recently had her mammogram. She'll continue with annual mammography. SBE monthly reviewed. 3. Pap smear. Patient has no history of abnormal Pap smears. No Pap smear done today. Last Pap smear reported September 2012 at her other physician's office. Reviewed current screening guidelines and recommend Pap smears every 3-5 years. 4. Bone density. No bone density done yet. Increase calcium vitamin D reviewed. We'll plan DEXA closer to 60. 5. Colonoscopy. Patient had colonoscopy 6 years ago with recommended repeat a 10 year interval. She does get annual stool guaiacs to her other physician's office per her history. 6. Health maintenance. No blood work was done today she reports having all done through her primary physician's office. Patient will see me in 3 months in follow up and we'll go from there.    Dara Lords MD, 11:02 AM 10/24/2011

## 2011-10-24 NOTE — Patient Instructions (Signed)
Start estrogen patches. Take progesterone pill nightly. Take Diflucan weekly for yeast suppression. Follow up with me in 3 months. Call me sooner if you have any issues.

## 2012-02-03 ENCOUNTER — Ambulatory Visit (INDEPENDENT_AMBULATORY_CARE_PROVIDER_SITE_OTHER): Payer: BC Managed Care – PPO | Admitting: Gynecology

## 2012-02-03 ENCOUNTER — Encounter: Payer: Self-pay | Admitting: Gynecology

## 2012-02-03 DIAGNOSIS — N952 Postmenopausal atrophic vaginitis: Secondary | ICD-10-CM

## 2012-02-03 DIAGNOSIS — N950 Postmenopausal bleeding: Secondary | ICD-10-CM

## 2012-02-03 NOTE — Patient Instructions (Signed)
Continue on estrogen patches and progesterone pills. Add estrogen cream vaginally.  Follow up for ultrasound as scheduled.

## 2012-02-03 NOTE — Progress Notes (Signed)
Patient follows up having started HRT to include the Vivelle patch 0.05 and Prometrium 100 mg nightly for full far atrophic changes, clitoral retractions and lack of orgasm. Patient unfortunately notices no difference. Does note spotting on and off. Has history of endometrial polyps in the past.  Exam with [facility] assistant Abdomen soft nontender without masses guarding rebound organomegaly. Pelvic external BUS vagina with atrophic changes. Cervix normal with bloody mucoid discharge. Uterus normal size mobile nontender. Adnexa without masses or tenderness.  Assessment and plan: 1. Atrophic genital changes. Anorgasmic. Options for management to include more aggressive estrogen such as adding estrogen cream to the Vivelle patch/Prometrium. Trial of Osphena. I reviewed Osphena with her to include the risks/benefits. Increased hot flush risk in the issues of thrombosis/endometrial stimulation/breast/bone stimulation all reviewed. Patient wants to try the estrogen cream on top of the Vivelle/Prometrium. We'll follow up in several months. It continues without change them we'll try Osphena. 2. Postmenopausal spotting. Most likely due to starting the HRT given her history I think we need to rule out endometrial pathology. Start with sonohysterogram and patient will follow up for this and agrees with this.

## 2012-02-13 ENCOUNTER — Ambulatory Visit (INDEPENDENT_AMBULATORY_CARE_PROVIDER_SITE_OTHER): Payer: BC Managed Care – PPO

## 2012-02-13 ENCOUNTER — Encounter: Payer: Self-pay | Admitting: Gynecology

## 2012-02-13 ENCOUNTER — Other Ambulatory Visit: Payer: Self-pay | Admitting: Gynecology

## 2012-02-13 ENCOUNTER — Ambulatory Visit (INDEPENDENT_AMBULATORY_CARE_PROVIDER_SITE_OTHER): Payer: BC Managed Care – PPO | Admitting: Gynecology

## 2012-02-13 DIAGNOSIS — D251 Intramural leiomyoma of uterus: Secondary | ICD-10-CM

## 2012-02-13 DIAGNOSIS — N950 Postmenopausal bleeding: Secondary | ICD-10-CM

## 2012-02-13 DIAGNOSIS — N952 Postmenopausal atrophic vaginitis: Secondary | ICD-10-CM

## 2012-02-13 DIAGNOSIS — D259 Leiomyoma of uterus, unspecified: Secondary | ICD-10-CM

## 2012-02-13 DIAGNOSIS — N852 Hypertrophy of uterus: Secondary | ICD-10-CM

## 2012-02-13 DIAGNOSIS — N83339 Acquired atrophy of ovary and fallopian tube, unspecified side: Secondary | ICD-10-CM

## 2012-02-13 NOTE — Progress Notes (Signed)
Patient presents for history and due to postmenopausal bleeding on HRT. Ultrasound shows normal uterine size with several small myomas the largest measuring 21 mm. Endometrial echo 4.8 mm.  Right/left ovaries grossly normal. Cul-de-sac negative.  Cystogram performed sterile technique easy catheter introduction good distention with no abnormalities. Endometrial sample taken. Patient tolerated well.  Assessment and plan: Post menopausal bleeding on HRT. Ultrasound negative. Will follow for biopsy. Assuming negative then plan expectant management. We'll continue with MiniVivelle/Prometrium HRT and Estrace periclitorally. Follow up as needed.  Office will contact her with the biopsy results. If bleeding continues or recurs she knows to follow up.

## 2012-02-13 NOTE — Patient Instructions (Signed)
Office will call you with the biopsy results. Continue with the extra estrogen application. Follow up as we discussed.

## 2012-10-19 ENCOUNTER — Other Ambulatory Visit: Payer: Self-pay | Admitting: Gynecology

## 2012-11-16 ENCOUNTER — Other Ambulatory Visit: Payer: Self-pay | Admitting: Gynecology

## 2012-11-19 ENCOUNTER — Other Ambulatory Visit: Payer: Self-pay | Admitting: Gynecology

## 2012-12-10 DIAGNOSIS — M8580 Other specified disorders of bone density and structure, unspecified site: Secondary | ICD-10-CM

## 2012-12-10 HISTORY — DX: Other specified disorders of bone density and structure, unspecified site: M85.80

## 2013-01-02 ENCOUNTER — Other Ambulatory Visit (HOSPITAL_COMMUNITY)
Admission: RE | Admit: 2013-01-02 | Discharge: 2013-01-02 | Disposition: A | Discharge status: Home / Self Care | Payer: BC Managed Care – PPO | Source: Ambulatory Visit | Attending: Gynecology | Admitting: Gynecology

## 2013-01-02 ENCOUNTER — Encounter: Payer: Self-pay | Admitting: Gynecology

## 2013-01-02 ENCOUNTER — Ambulatory Visit (INDEPENDENT_AMBULATORY_CARE_PROVIDER_SITE_OTHER): Payer: BC Managed Care – PPO | Admitting: Gynecology

## 2013-01-02 VITALS — BP 110/70 | Ht 70.0 in | Wt 118.0 lb

## 2013-01-02 DIAGNOSIS — N952 Postmenopausal atrophic vaginitis: Secondary | ICD-10-CM

## 2013-01-02 DIAGNOSIS — M8580 Other specified disorders of bone density and structure, unspecified site: Secondary | ICD-10-CM

## 2013-01-02 DIAGNOSIS — Z01419 Encounter for gynecological examination (general) (routine) without abnormal findings: Secondary | ICD-10-CM | POA: Insufficient documentation

## 2013-01-02 DIAGNOSIS — Z1151 Encounter for screening for human papillomavirus (HPV): Secondary | ICD-10-CM | POA: Insufficient documentation

## 2013-01-02 DIAGNOSIS — Z79890 Hormone replacement therapy (postmenopausal): Secondary | ICD-10-CM

## 2013-01-02 DIAGNOSIS — Z13220 Encounter for screening for lipoid disorders: Secondary | ICD-10-CM

## 2013-01-02 DIAGNOSIS — M899 Disorder of bone, unspecified: Secondary | ICD-10-CM

## 2013-01-02 LAB — CBC WITH DIFFERENTIAL/PLATELET
Eosinophils Absolute: 0.1 10*3/uL (ref 0.0–0.7)
Eosinophils Relative: 1 % (ref 0–5)
HCT: 43.4 % (ref 36.0–46.0)
Lymphs Abs: 2.3 10*3/uL (ref 0.7–4.0)
MCH: 30 pg (ref 26.0–34.0)
MCHC: 33.2 g/dL (ref 30.0–36.0)
MCV: 90.4 fL (ref 78.0–100.0)
Platelets: 194 10*3/uL (ref 150–400)
RBC: 4.8 MIL/uL (ref 3.87–5.11)
RDW: 13.3 % (ref 11.5–15.5)
WBC: 9.5 10*3/uL (ref 4.0–10.5)

## 2013-01-02 MED ORDER — ESTRADIOL 0.1 MG/GM VA CREA: 2.0000 g | TOPICAL_CREAM | Freq: Every day | VAGINAL | Status: DC

## 2013-01-02 NOTE — Progress Notes (Signed)
Emily Mcknight [birth year] [id number]        57 y.o.  G2P2002 for annual exam.  Several issues noted below.  Past medical history,surgical history, medications, allergies, family history and social history were all reviewed and documented in the EPIC chart.  ROS:  Performed and pertinent positives and negatives are included in the history, assessment and plan .  Exam: Kim assistant Filed Vitals:   01/02/13 1544  BP: 110/70  Height: 5\' 10"  (1.778 m)  Weight: 118 lb (53.524 kg)   General appearance  Normal Skin grossly normal Head/Neck normal with no cervical or supraclavicular adenopathy thyroid normal Lungs  clear Cardiac RR, without RMG Abdominal  soft, nontender, without masses, organomegaly or hernia Breasts  examined lying and sitting without masses, retractions, discharge or axillary adenopathy. Pelvic  Ext/BUS/vagina  atrophy of the clitoral hood and clitoris clearly present and palpable. Labia minora normal in appearance with overall atrophic changes externally.  Cervix  normal  Uterus  anteverted, normal size, shape and contour, midline and mobile nontender   Adnexa  Without masses or tenderness    Anus and perineum  normal   Rectovaginal  normal sphincter tone without palpated masses or tenderness.    Assessment/Plan:  57 y.o. G59P2002 female for annual exam.   1. Postmenopausal/genital atrophy. Patient still concerned about genital atrophy. We started her on HRT to include Minivelle and Prometrium. Was having some hot flashes but says it they were tolerable and the the main reason for estrogen was genital atrophy. She also was noted to be anorgasmic. We have previously discussed options and I again reviewed them today is for his genital atrophy to include continuing global HRT, vaginal estrogen cream, Vagifem, Osphena. Risks/benefits, pros/cons of each choice discussed. I also again reviewed the whole issue of HRT with her to include the WHI study with increased risk of  stroke, heart attack, DVT and breast cancer. The ACOG and NAMS statements for lowest dose for the shortest period of time reviewed. Transdermal versus oral first-pass effect benefit discussed. After lengthy discussion the patient wants to stop her HRT and try vaginal estrogen cream 3 times weekly. Estrace vaginal cream was prescribed. Of note she has become orgasmic over the past year. Patient will go ahead and start his regimen and call me if she has any issues. Patient knows to report any vaginal bleeding. 2. Pap smear reported 2012. Pap/HPV done today as I have no copy of this. No history of abnormal Pap smears previously. Assuming negative implant less frequent screening intervals. 3. Mammography 10/2011. Patient is to repeat now. SBE monthly reviewed. 4. Osteopenia. DEXA 2009 with T score -1.2. Recommend repeat now and she will schedule. Increase calcium vitamin D reviewed. Check vitamin D level today. 5. Colonoscopy 2007 with recommended repeat at 10 year interval. 6. Health maintenance. Baseline CBC comprehensive metabolic panel lipid profile urinalysis vitamin D TSH ordered. Followup one year, sooner as needed.   Note: This document was prepared with digital dictation and possible smart phrase technology. Any transcriptional errors that result from this process are unintentional.   Dara Lords MD, 5:04 PM 01/02/2013

## 2013-01-02 NOTE — Patient Instructions (Signed)
Followup for bone density as scheduled. Start on the vaginal estrogen cream. Call me if you have any issues. Otherwise followup in one year for annual exam.

## 2013-01-03 ENCOUNTER — Encounter: Payer: Self-pay | Admitting: Gynecology

## 2013-01-03 ENCOUNTER — Other Ambulatory Visit: Payer: Self-pay | Admitting: *Deleted

## 2013-01-03 ENCOUNTER — Ambulatory Visit (INDEPENDENT_AMBULATORY_CARE_PROVIDER_SITE_OTHER): Payer: BC Managed Care – PPO

## 2013-01-03 DIAGNOSIS — Z1329 Encounter for screening for other suspected endocrine disorder: Secondary | ICD-10-CM

## 2013-01-03 DIAGNOSIS — R7309 Other abnormal glucose: Secondary | ICD-10-CM

## 2013-01-03 DIAGNOSIS — M8580 Other specified disorders of bone density and structure, unspecified site: Secondary | ICD-10-CM

## 2013-01-03 DIAGNOSIS — M899 Disorder of bone, unspecified: Secondary | ICD-10-CM

## 2013-01-03 LAB — URINALYSIS W MICROSCOPIC + REFLEX CULTURE
Bilirubin Urine: NEGATIVE
Hgb urine dipstick: NEGATIVE
Ketones, ur: NEGATIVE mg/dL
Protein, ur: NEGATIVE mg/dL
Specific Gravity, Urine: 1.015 (ref 1.005–1.030)
Urobilinogen, UA: 0.2 mg/dL (ref 0.0–1.0)

## 2013-01-03 LAB — TSH: TSH: 0.235 u[IU]/mL — ABNORMAL LOW (ref 0.350–4.500)

## 2013-01-03 LAB — COMPREHENSIVE METABOLIC PANEL
ALT: 24 U/L (ref 0–35)
AST: 29 U/L (ref 0–37)
Alkaline Phosphatase: 48 U/L (ref 39–117)
CO2: 27 mEq/L (ref 19–32)
Creat: 1.09 mg/dL (ref 0.50–1.10)
Glucose, Bld: 129 mg/dL — ABNORMAL HIGH (ref 70–99)
Potassium: 3.7 mEq/L (ref 3.5–5.3)
Total Bilirubin: 1.1 mg/dL (ref 0.3–1.2)
Total Protein: 7.6 g/dL (ref 6.0–8.3)

## 2013-01-03 LAB — LIPID PANEL
Cholesterol: 187 mg/dL (ref 0–200)
Total CHOL/HDL Ratio: 2.3 Ratio
Triglycerides: 98 mg/dL (ref <150)
VLDL: 20 mg/dL (ref 0–40)

## 2013-01-04 ENCOUNTER — Other Ambulatory Visit: Payer: BC Managed Care – PPO

## 2013-01-04 ENCOUNTER — Other Ambulatory Visit: Payer: Self-pay | Admitting: Gynecology

## 2013-01-04 DIAGNOSIS — R7309 Other abnormal glucose: Secondary | ICD-10-CM

## 2013-01-04 DIAGNOSIS — Z1329 Encounter for screening for other suspected endocrine disorder: Secondary | ICD-10-CM

## 2013-01-04 LAB — URINE CULTURE: Colony Count: NO GROWTH

## 2013-01-08 ENCOUNTER — Other Ambulatory Visit: Payer: Self-pay | Admitting: Gynecology

## 2013-01-08 DIAGNOSIS — R7309 Other abnormal glucose: Secondary | ICD-10-CM

## 2013-01-08 LAB — THYROID PANEL WITH TSH
Free Thyroxine Index: 3.1 (ref 1.0–3.9)
T3 Uptake: 36.7 % (ref 22.5–37.0)
TSH: 0.313 u[IU]/mL — ABNORMAL LOW (ref 0.350–4.500)

## 2013-02-26 ENCOUNTER — Other Ambulatory Visit: Payer: Self-pay | Admitting: Gynecology

## 2013-02-26 MED ORDER — ESTRADIOL 0.1 MG/GM VA CREA: 2.0000 g | TOPICAL_CREAM | Freq: Every day | VAGINAL | Status: AC

## 2013-05-27 ENCOUNTER — Ambulatory Visit (INDEPENDENT_AMBULATORY_CARE_PROVIDER_SITE_OTHER): Payer: BC Managed Care – PPO | Admitting: Gynecology

## 2013-05-27 ENCOUNTER — Encounter: Payer: Self-pay | Admitting: Gynecology

## 2013-05-27 DIAGNOSIS — R2230 Localized swelling, mass and lump, unspecified upper limb: Secondary | ICD-10-CM

## 2013-05-27 DIAGNOSIS — R229 Localized swelling, mass and lump, unspecified: Secondary | ICD-10-CM

## 2013-05-27 NOTE — Progress Notes (Signed)
Emily Mcknight 03/04/1956 [id number]        58 y.o.  G2P2002 presents with 2 week history of left axillary mass. Initially tender and seems to be getting less tender and a little smaller. No history of same before. Last mammogram 10/2011.  Past medical history,surgical history, problem list, medications, allergies, family history and social history were all reviewed and documented in the EPIC chart.  Exam: Kim assistant General appearance  Normal  Both breast examined lying and sitting. Right breast without masses retractions discharge or adenopathy. Left breast without masses retractions or discharge. Mobile well-circumscribed 2-3 cm mass mid outer axillary region. No overlying skin changes. Does not feel fixed.  Assessment/Plan:  58 y.o. G2P2002  axillary mass x2 weeks. Start with mammography and axillary ultrasound. General surgical referral regardless to consider excision. Patient actually has moved to [facility] and asked about arranging care. Absolute need for followup stressed whether done here or in [place] reviewed. Patient understands the possibilities to include malignancy and the importance of followup.   Note: This document was prepared with digital dictation and possible smart phrase technology. Any transcriptional errors that result from this process are unintentional.   Anastasio Auerbach MD, 12:31 PM 05/27/2013

## 2013-05-27 NOTE — Patient Instructions (Signed)
Office will contact you to arrange the mammogram, ultrasound and general surgical referral.

## 2013-05-28 ENCOUNTER — Ambulatory Visit: Payer: BC Managed Care – PPO | Admitting: Gynecology

## 2013-05-30 ENCOUNTER — Telehealth: Payer: Self-pay | Admitting: *Deleted

## 2013-05-30 DIAGNOSIS — N6320 Unspecified lump in the left breast, unspecified quadrant: Secondary | ICD-10-CM

## 2013-05-30 NOTE — Telephone Encounter (Signed)
Message copied by Thamas Jaegers on Thu May 30, 2013 10:07 AM ------      Message from: Anastasio Auerbach      Created: Mon May 27, 2013 12:33 PM       Patient needs bilateral mammography and left axillary ultrasound reference 2-3 cm left axillary mass. She also needs a followup appointment with a general surgeon one to 2 weeks after the above studies regardless. She has moved to [facility] and was asking about arranging care. Check with her first before scheduling to make sure where she wants this done. If she's interested in [place] she will get [place] the names so that we can arrange the appointments there. Otherwise if she decides to do it here then breast Center and general surgeon ------

## 2013-05-30 NOTE — Telephone Encounter (Signed)
Appt. 06/10/13 @ 9:35 am at the [facility] breast center order was signed and faxed. Pt is aware that films from [place] [facility] will needed at new appointment. Appt on 06/17/13 @ 10:00 am with Dr.Byerly at [facility] [place] [facility] pt aware of all of this.

## 2013-06-14 ENCOUNTER — Telehealth: Payer: Self-pay | Admitting: Gynecology

## 2013-06-14 NOTE — Telephone Encounter (Signed)
Tell patient I received the mammogram and ultrasound report where they had recommended a surgical consult. I just wanted to make sure that she is making arrangements to see a general surgeon in reference to the left breast lump.

## 2013-06-17 ENCOUNTER — Encounter (HOSPITAL_BASED_OUTPATIENT_CLINIC_OR_DEPARTMENT_OTHER): Payer: Self-pay | Admitting: *Deleted

## 2013-06-17 ENCOUNTER — Encounter (INDEPENDENT_AMBULATORY_CARE_PROVIDER_SITE_OTHER): Payer: Self-pay | Admitting: General Surgery

## 2013-06-17 ENCOUNTER — Ambulatory Visit (INDEPENDENT_AMBULATORY_CARE_PROVIDER_SITE_OTHER): Payer: BC Managed Care – PPO | Admitting: General Surgery

## 2013-06-17 VITALS — BP 124/80 | HR 80 | Temp 97.6°F | Resp 16 | Ht 70.0 in | Wt 128.0 lb

## 2013-06-17 DIAGNOSIS — D3610 Benign neoplasm of peripheral nerves and autonomic nervous system, unspecified: Secondary | ICD-10-CM | POA: Insufficient documentation

## 2013-06-17 DIAGNOSIS — R229 Localized swelling, mass and lump, unspecified: Secondary | ICD-10-CM

## 2013-06-17 DIAGNOSIS — R2230 Localized swelling, mass and lump, unspecified upper limb: Secondary | ICD-10-CM

## 2013-06-17 NOTE — Telephone Encounter (Signed)
Pt has Appt on 06/17/13 @ 10:00 am with Dr.Byerly

## 2013-06-17 NOTE — Patient Instructions (Signed)
Main risks are bleeding, infection, damage to adjacent structures, swelling, numbness.  If we find cancer, we will need to do additional imaging procedures.    No aspirin or ibuprofen until surgery (as long as this week)

## 2013-06-17 NOTE — Progress Notes (Signed)
Chief Complaint  Patient presents with  . eval breast mass    lt breast    HISTORY: Patient is a 57-year-old female who presents with a left axillary mass for around a month. This was originally larger but has stayed the same size now for around 3 weeks. It is slightly tender. She denies any trauma to her arm. She has no medical problems. She has recently moved to [place] from [place] but has had all of her medical care [place]. She underwent mammogram and ultrasound in [place]. I have the images available but the report. There is a mass on ultrasound in her axilla. She also has heterogeneously dense breast tissue.  Past Medical History  Diagnosis Date  . Headache(784.0)   . Morton's neuroma   . Endometrial polyp   . Osteopenia 12/2012    T score -1.7 FRAX 6%/0.6%    Past Surgical History  Procedure Laterality Date  . Hysteroscopy  2006 and 2009    polyp  . Cystectomy  1973    neck benign  . Foot neuroma surgery  2001    left    Current Outpatient Prescriptions  Medication Sig Dispense Refill  . Eletriptan Hydrobromide (RELPAX PO) Take by mouth.        . estradiol (ESTRACE VAGINAL) 0.1 MG/GM vaginal cream Place 0.25 Applicatorfuls vaginally daily.  127.5 g  3   No current facility-administered medications for this visit.     No Known Allergies   Family History  Problem Relation Age of Onset  . Diabetes Maternal Grandmother   . Cancer Maternal Grandmother     pancreatic cancer  . Liver cancer Paternal Grandmother   . Heart disease Paternal Grandfather      History   Social History  . Marital Status: Married    Spouse Name: N/A    Number of Children: N/A  . Years of Education: N/A   Social History Main Topics  . Smoking status: Never Smoker   . Smokeless tobacco: Never Used  . Alcohol Use: 1.5 oz/week    3 drink(s) per week     Comment: social  . Drug Use: No  . Sexual Activity: Yes    Partners: Male    Birth Control/ Protection:  Post-menopausal   Other Topics Concern  . None   Social History Narrative  . None     REVIEW OF SYSTEMS - PERTINENT POSITIVES ONLY: 12 point review of systems negative other than HPI and PMH except for headaches  EXAM: Filed Vitals:   06/17/13 1005  BP: 124/80  Pulse: 80  Temp: 97.6 F (36.4 C)  Resp: 16    Wt Readings from Last 3 Encounters:  06/17/13 128 lb (58.06 kg)  06/17/13 128 lb (58.06 kg)  01/02/13 118 lb (53.524 kg)     Gen:  No acute distress.  Well nourished and well groomed.   Neurological: Alert and oriented to person, place, and time. Coordination normal.  Head: Normocephalic and atraumatic.  Eyes: Conjunctivae are normal. Pupils are equal, round, and reactive to light. No scleral icterus.  Neck: Normal range of motion. Neck supple. No tracheal deviation or thyromegaly present.  Cardiovascular: Normal rate, regular rhythm, and intact distal pulses.   Breast: dense breast tissue bilaterally.  No palpable breast masses.  2 cm mass distal to axillary crease.   Respiratory: Effort normal.  No respiratory distress. No chest wall tenderness. GI: Soft. The abdomen is soft and nontender.  There is no rebound and no   guarding.  Musculoskeletal: Normal range of motion. Extremities are nontender.  Lymphadenopathy: No cervical, preauricular, postauricular or axillary adenopathy is present Skin: Skin is warm and dry. No rash noted. No diaphoresis. No erythema. No pallor. No clubbing, cyanosis, or edema.   Psychiatric: Normal mood and affect. Behavior is normal. Judgment and thought content normal.    LABORATORY RESULTS: Available labs are reviewed   No results found for this or any previous visit (from the past 2160 hour(s)).   RADIOLOGY RESULTS: [facility] is 1.4x2.2x1.2 cm in axilla, hypoechoic.       ASSESSMENT AND PLAN: left Axillary mass Pt appears to have lipoma vs LN in left axilla.   This is distal to axillary crease,  more in arm, and it is symptomatic. Will plan to excise it.    Reviewed risks of surgery including bleeding, infection, damage to adjacent structures, numbness, cancer diagnosis, need for additional procedures.  I offered ultrasound guided FNA, but given location of mass more in arm, I feel this is less likely to be cancer.        Milus Height MD Surgical Oncology, [facility]      Visit Diagnoses: 1. left Axillary mass     Primary Care Physician: PROVIDER NOT IN SYSTEM [facility]

## 2013-06-17 NOTE — Assessment & Plan Note (Signed)
Pt appears to have lipoma vs LN in left axilla.   This is distal to axillary crease, more in arm, and it is symptomatic. Will plan to excise it.    Reviewed risks of surgery including bleeding, infection, damage to adjacent structures, numbness, cancer diagnosis, need for additional procedures.  I offered ultrasound guided FNA, but given location of mass more in arm, I feel this is less likely to be cancer.

## 2013-06-18 ENCOUNTER — Encounter (HOSPITAL_BASED_OUTPATIENT_CLINIC_OR_DEPARTMENT_OTHER): Payer: BC Managed Care – PPO | Admitting: Anesthesiology

## 2013-06-18 ENCOUNTER — Encounter (HOSPITAL_BASED_OUTPATIENT_CLINIC_OR_DEPARTMENT_OTHER): Payer: Self-pay | Admitting: *Deleted

## 2013-06-18 ENCOUNTER — Encounter (HOSPITAL_BASED_OUTPATIENT_CLINIC_OR_DEPARTMENT_OTHER)
Admission: RE | Disposition: A | Discharge status: Home / Self Care | Payer: Self-pay | Source: Ambulatory Visit | Attending: General Surgery

## 2013-06-18 ENCOUNTER — Ambulatory Visit (HOSPITAL_BASED_OUTPATIENT_CLINIC_OR_DEPARTMENT_OTHER): Payer: BC Managed Care – PPO | Admitting: Anesthesiology

## 2013-06-18 ENCOUNTER — Ambulatory Visit (HOSPITAL_BASED_OUTPATIENT_CLINIC_OR_DEPARTMENT_OTHER)
Admission: RE | Admit: 2013-06-18 | Discharge: 2013-06-18 | Disposition: A | Discharge status: Home / Self Care | Payer: BC Managed Care – PPO | Source: Ambulatory Visit | Attending: General Surgery | Admitting: General Surgery

## 2013-06-18 DIAGNOSIS — M799 Soft tissue disorder, unspecified: Secondary | ICD-10-CM | POA: Insufficient documentation

## 2013-06-18 DIAGNOSIS — D213 Benign neoplasm of connective and other soft tissue of thorax: Secondary | ICD-10-CM

## 2013-06-18 HISTORY — PX: MASS EXCISION: SHX2000

## 2013-06-18 LAB — POCT HEMOGLOBIN-HEMACUE: HEMOGLOBIN: 14.3 g/dL (ref 12.0–15.0)

## 2013-06-18 SURGERY — EXCISION MASS
Anesthesia: General | Site: Axilla | Laterality: Left

## 2013-06-18 MED ORDER — MIDAZOLAM HCL 2 MG/2ML IJ SOLN: 1.0000 mg | INTRAMUSCULAR | Status: DC | PRN

## 2013-06-18 MED ORDER — OXYCODONE HCL 5 MG PO TABS
5.0000 mg | ORAL_TABLET | ORAL | Status: DC | PRN
  Administered 2013-06-18: 5 mg via ORAL

## 2013-06-18 MED ORDER — BUPIVACAINE HCL (PF) 0.25 % IJ SOLN
INTRAMUSCULAR | Status: DC | PRN
  Administered 2013-06-18: 2 mL

## 2013-06-18 MED ORDER — HYDROMORPHONE HCL PF 1 MG/ML IJ SOLN: 0.2500 mg | INTRAMUSCULAR | Status: DC | PRN

## 2013-06-18 MED ORDER — CEFAZOLIN SODIUM-DEXTROSE 2-3 GM-% IV SOLR
2.0000 g | INTRAVENOUS | Status: AC
  Administered 2013-06-18: 2 g via INTRAVENOUS

## 2013-06-18 MED ORDER — PROPOFOL 10 MG/ML IV BOLUS
INTRAVENOUS | Status: DC | PRN
  Administered 2013-06-18: 200 mg via INTRAVENOUS

## 2013-06-18 MED ORDER — HYDROCODONE-ACETAMINOPHEN 5-325 MG PO TABS: 1.0000 | ORAL_TABLET | ORAL | Status: AC | PRN

## 2013-06-18 MED ORDER — FENTANYL CITRATE 0.05 MG/ML IJ SOLN
INTRAMUSCULAR | Status: DC | PRN
Start: 2013-06-18 — End: 2013-06-18
  Administered 2013-06-18: 100 ug via INTRAVENOUS

## 2013-06-18 MED ORDER — ACETAMINOPHEN 325 MG PO TABS: 650.0000 mg | ORAL_TABLET | ORAL | Status: DC | PRN

## 2013-06-18 MED ORDER — BUPIVACAINE HCL (PF) 0.25 % IJ SOLN
INTRAMUSCULAR | Status: AC
  Filled 2013-06-18: qty 30

## 2013-06-18 MED ORDER — ONDANSETRON HCL 4 MG/2ML IJ SOLN: 4.0000 mg | Freq: Four times a day (QID) | INTRAMUSCULAR | Status: DC | PRN

## 2013-06-18 MED ORDER — FENTANYL CITRATE 0.05 MG/ML IJ SOLN
INTRAMUSCULAR | Status: AC
  Filled 2013-06-18: qty 4

## 2013-06-18 MED ORDER — ONDANSETRON HCL 4 MG/2ML IJ SOLN
INTRAMUSCULAR | Status: DC | PRN
  Administered 2013-06-18: 4 mg via INTRAVENOUS

## 2013-06-18 MED ORDER — MIDAZOLAM HCL 2 MG/2ML IJ SOLN
INTRAMUSCULAR | Status: AC
  Filled 2013-06-18: qty 2

## 2013-06-18 MED ORDER — DEXAMETHASONE SODIUM PHOSPHATE 4 MG/ML IJ SOLN
INTRAMUSCULAR | Status: DC | PRN
  Administered 2013-06-18: 10 mg via INTRAVENOUS

## 2013-06-18 MED ORDER — SODIUM CHLORIDE 0.9 % IJ SOLN: 3.0000 mL | INTRAMUSCULAR | Status: DC | PRN

## 2013-06-18 MED ORDER — SODIUM CHLORIDE 0.9 % IV SOLN: 250.0000 mL | INTRAVENOUS | Status: DC | PRN

## 2013-06-18 MED ORDER — PROPOFOL 10 MG/ML IV BOLUS
INTRAVENOUS | Status: AC
  Filled 2013-06-18: qty 20

## 2013-06-18 MED ORDER — ACETAMINOPHEN 650 MG RE SUPP: 650.0000 mg | RECTAL | Status: DC | PRN

## 2013-06-18 MED ORDER — OXYCODONE HCL 5 MG PO TABS
ORAL_TABLET | ORAL | Status: AC
  Filled 2013-06-18: qty 1

## 2013-06-18 MED ORDER — MIDAZOLAM HCL 5 MG/5ML IJ SOLN
INTRAMUSCULAR | Status: DC | PRN
Start: 2013-06-18 — End: 2013-06-18
  Administered 2013-06-18: 2 mg via INTRAVENOUS

## 2013-06-18 MED ORDER — LIDOCAINE HCL (CARDIAC) 20 MG/ML IV SOLN
INTRAVENOUS | Status: DC | PRN
  Administered 2013-06-18: 60 mg via INTRAVENOUS

## 2013-06-18 MED ORDER — EPHEDRINE SULFATE 50 MG/ML IJ SOLN
INTRAMUSCULAR | Status: DC | PRN
  Administered 2013-06-18: 10 mg via INTRAVENOUS

## 2013-06-18 MED ORDER — CEFAZOLIN SODIUM-DEXTROSE 2-3 GM-% IV SOLR
INTRAVENOUS | Status: AC
  Filled 2013-06-18: qty 50

## 2013-06-18 MED ORDER — SODIUM CHLORIDE 0.9 % IJ SOLN: 3.0000 mL | Freq: Two times a day (BID) | INTRAMUSCULAR | Status: DC

## 2013-06-18 MED ORDER — FENTANYL CITRATE 0.05 MG/ML IJ SOLN: 50.0000 ug | INTRAMUSCULAR | Status: DC | PRN

## 2013-06-18 MED ORDER — LACTATED RINGERS IV SOLN
INTRAVENOUS | Status: DC
  Administered 2013-06-18 (×2): via INTRAVENOUS

## 2013-06-18 SURGICAL SUPPLY — 48 items
ADH SKN CLS APL DERMABOND .7 (GAUZE/BANDAGES/DRESSINGS) ×1
BLADE HEX COATED 2.75 (ELECTRODE) ×3 IMPLANT
BLADE SURG 15 STRL LF DISP TIS (BLADE) ×1 IMPLANT
BLADE SURG 15 STRL SS (BLADE) ×3
BNDG COHESIVE 4X5 TAN STRL (GAUZE/BANDAGES/DRESSINGS) ×2 IMPLANT
CANISTER SUCT 1200ML W/VALVE (MISCELLANEOUS) IMPLANT
CHLORAPREP W/TINT 26ML (MISCELLANEOUS) ×3 IMPLANT
COVER MAYO STAND STRL (DRAPES) IMPLANT
COVER TABLE BACK 60X90 (DRAPES) IMPLANT
DECANTER SPIKE VIAL GLASS SM (MISCELLANEOUS) IMPLANT
DERMABOND ADVANCED (GAUZE/BANDAGES/DRESSINGS) ×2
DERMABOND ADVANCED .7 DNX12 (GAUZE/BANDAGES/DRESSINGS) ×1 IMPLANT
DRAPE PED LAPAROTOMY (DRAPES) IMPLANT
DRAPE UTILITY XL STRL (DRAPES) ×3 IMPLANT
ELECT REM PT RETURN 9FT ADLT (ELECTROSURGICAL) ×3
ELECTRODE REM PT RTRN 9FT ADLT (ELECTROSURGICAL) ×1 IMPLANT
GLOVE BIO SURGEON STRL SZ 6 (GLOVE) ×3 IMPLANT
GLOVE BIOGEL PI IND STRL 6.5 (GLOVE) ×1 IMPLANT
GLOVE BIOGEL PI IND STRL 8 (GLOVE) IMPLANT
GLOVE BIOGEL PI INDICATOR 6.5 (GLOVE) ×2
GLOVE BIOGEL PI INDICATOR 8 (GLOVE) ×2
GLOVE SURG SS PI 8.0 STRL IVOR (GLOVE) ×2 IMPLANT
GOWN STRL REUS W/ TWL LRG LVL3 (GOWN DISPOSABLE) ×1 IMPLANT
GOWN STRL REUS W/TWL 2XL LVL3 (GOWN DISPOSABLE) ×3 IMPLANT
GOWN STRL REUS W/TWL LRG LVL3 (GOWN DISPOSABLE) ×6
NDL HYPO 25X1 1.5 SAFETY (NEEDLE) ×1 IMPLANT
NEEDLE HYPO 25X1 1.5 SAFETY (NEEDLE) ×3 IMPLANT
NS IRRIG 1000ML POUR BTL (IV SOLUTION) IMPLANT
PACK BASIN DAY SURGERY FS (CUSTOM PROCEDURE TRAY) ×3 IMPLANT
PACK UNIVERSAL I (CUSTOM PROCEDURE TRAY) IMPLANT
PENCIL BUTTON HOLSTER BLD 10FT (ELECTRODE) ×3 IMPLANT
SLEEVE SCD COMPRESS KNEE MED (MISCELLANEOUS) IMPLANT
SPONGE GAUZE 4X4 12PLY STER LF (GAUZE/BANDAGES/DRESSINGS) ×3 IMPLANT
SPONGE LAP 4X18 X RAY DECT (DISPOSABLE) ×3 IMPLANT
STAPLER VISISTAT 35W (STAPLE) IMPLANT
STOCKINETTE IMPERVIOUS LG (DRAPES) ×2 IMPLANT
SUT MNCRL AB 4-0 PS2 18 (SUTURE) ×3 IMPLANT
SUT SILK 3 0 TIES 17X18 (SUTURE)
SUT SILK 3-0 18XBRD TIE BLK (SUTURE) IMPLANT
SUT VIC AB 3-0 SH 27 (SUTURE) ×3
SUT VIC AB 3-0 SH 27X BRD (SUTURE) ×1 IMPLANT
SUT VICRYL 4-0 PS2 18IN ABS (SUTURE) ×3 IMPLANT
SYR CONTROL 10ML LL (SYRINGE) ×3 IMPLANT
TOWEL OR 17X24 6PK STRL BLUE (TOWEL DISPOSABLE) ×3 IMPLANT
TOWEL OR NON WOVEN STRL DISP B (DISPOSABLE) ×1 IMPLANT
TUBE CONNECTING 20'X1/4 (TUBING) ×1
TUBE CONNECTING 20X1/4 (TUBING) ×1 IMPLANT
YANKAUER SUCT BULB TIP NO VENT (SUCTIONS) ×2 IMPLANT

## 2013-06-18 NOTE — Anesthesia Postprocedure Evaluation (Signed)
  Anesthesia Post-op Note  Patient: Emily Mcknight  Procedure(s) Performed: Procedure(s): EXCISION AXILLARY MASS (Left)  Patient Location: PACU  Anesthesia Type:General  Level of Consciousness: awake  Airway and Oxygen Therapy: Patient Spontanous Breathing  Post-op Pain: mild  Post-op Assessment: Post-op Vital signs reviewed  Post-op Vital Signs: Reviewed  Complications: No apparent anesthesia complications

## 2013-06-18 NOTE — Discharge Instructions (Addendum)
[facility] °Office Phone Number [phone] ° ° POST OP INSTRUCTIONS ° °Always review your discharge instruction sheet given to you by the facility where your surgery was performed. ° °IF YOU HAVE DISABILITY OR FAMILY LEAVE FORMS, YOU MUST BRING THEM TO THE OFFICE FOR PROCESSING.  DO NOT GIVE THEM TO YOUR DOCTOR. ° °1. A prescription for pain medication may be given to you upon discharge.  Take your pain medication as prescribed, if needed.  If narcotic pain medicine is not needed, then you may take acetaminophen (Tylenol) or ibuprofen (Advil) as needed. °2. Take your usually prescribed medications unless otherwise directed °3. If you need a refill on your pain medication, please contact your pharmacy.  They will contact our office to request authorization.  Prescriptions will not be filled after 5pm or on week-ends. °4. You should eat very light the first 24 hours after surgery, such as soup, crackers, pudding, etc.  Resume your normal diet the day after surgery °5. It is common to experience some constipation if taking pain medication after surgery.  Increasing fluid intake and taking a stool softener will usually help or prevent this problem from occurring.  A mild laxative (Milk of Magnesia or Miralax) should be taken according to package directions if there are no bowel movements after 48 hours. °6. You may shower in 48 hours.  The surgical glue will flake off in 2-3 weeks.   °7. ACTIVITIES:  No strenuous activity or heavy lifting for 1 week.   °a. You may drive when you no longer are taking prescription pain medication, you can comfortably wear a seatbelt, and you can safely maneuver your car and apply brakes. °b. RETURN TO WORK:  __________n/a_______________ °You should see your doctor in the office for a follow-up appointment approximately three-four weeks after your surgery.   ° °WHEN TO CALL YOUR DOCTOR: °1. Fever over 101.0 °2. Nausea and/or vomiting. °3. Extreme swelling or  bruising. °4. Continued bleeding from incision. °5. Increased pain, redness, or drainage from the incision. ° °The clinic staff is available to answer your questions during regular business hours.  Please don’t hesitate to call and ask to speak to one of the nurses for clinical concerns.  If you have a medical emergency, go to the nearest emergency room or call 911.  A surgeon from [facility] is always on call at the hospital. ° °For further questions, please visit [email or web address]  ° ° °Post Anesthesia Home Care Instructions ° °Activity: °Get plenty of rest for the remainder of the day. A responsible adult should stay with you for 24 hours following the procedure.  °For the next 24 hours, DO NOT: °-Drive a car °-Operate machinery °-Drink alcoholic beverages °-Take any medication unless instructed by your physician °-Make any legal decisions or sign important papers. ° °Meals: °Start with liquid foods such as gelatin or soup. Progress to regular foods as tolerated. Avoid greasy, spicy, heavy foods. If nausea and/or vomiting occur, drink only clear liquids until the nausea and/or vomiting subsides. Call your physician if vomiting continues. ° °Special Instructions/Symptoms: °Your throat may feel dry or sore from the anesthesia or the breathing tube placed in your throat during surgery. If this causes discomfort, gargle with warm salt water. The discomfort should disappear within 24 hours. ° °

## 2013-06-18 NOTE — Anesthesia Procedure Notes (Signed)
Procedure Name: LMA Insertion Date/Time: 06/18/2013 12:08 PM Performed by: Lyndee Leo Pre-anesthesia Checklist: Patient identified, Emergency Drugs available, Suction available and Patient being monitored Patient Re-evaluated:Patient Re-evaluated prior to inductionOxygen Delivery Method: Circle System Utilized Preoxygenation: Pre-oxygenation with 100% oxygen Intubation Type: IV induction Ventilation: Mask ventilation without difficulty LMA: LMA inserted LMA Size: 3.0 Number of attempts: 1 Airway Equipment and Method: bite block Placement Confirmation: positive ETCO2 Tube secured with: Tape Dental Injury: Teeth and Oropharynx as per pre-operative assessment

## 2013-06-18 NOTE — Addendum Note (Signed)
Addendum created 06/18/13 1328 by Tawni Millers, CRNA   Modules edited: Charges VN

## 2013-06-18 NOTE — Op Note (Signed)
PRE-OPERATIVE DIAGNOSIS: left axillary mass  POST-OPERATIVE DIAGNOSIS:  Same, subfascial 2 cm  PROCEDURE:  Procedure(s): Excision of left subfascial 2 cm axillary mass  SURGEON:  Surgeon(s): Stark Klein, MD  ANESTHESIA:   local and general  DRAINS: none   LOCAL MEDICATIONS USED:  MARCAINE    and LIDOCAINE   SPECIMEN:  Source of Specimen:  left axillary mass  DISPOSITION OF SPECIMEN:  PATHOLOGY  COUNTS:  YES  DICTATION: .Dragon Dictation  PLAN OF CARE: Discharge to home after PACU  PATIENT DISPOSITION:  PACU - hemodynamically stable.  FINDINGS:  Firm fatty mass posterior to brachial vein, along biceps tendon  EBL:  min  Procedure:   Patient was identified in the holding area. She was taken to the operating room where she was placed supine on the operating room table. General anesthesia was induced via LMA. The patient's left chest and left arm were prepped and draped in sterile fashion. Timeout was performed according to the surgical safety checklist. When all was correct, we continued.  A longitudinal incision was made in the arm just distal to the axillary crease. The brachial vein was immediately posterior to the incision. This was retracted inferiorly. The mass was identified in the subfascial location and this was carefully dissected away with the metzenbaum scissors.  There were several small nerves that were dissected away carefully.  This was also dissected away from the biceps tendon.  The mass was elevated and skeletonized until 2 small feeding vessels were identified.  These were clipped and divided.  The mass was passed off the table.    The wound was inspected for hemostasis.  This was achieved with cautery.  The wound was closed with 3-0 vicryl deep dermal interrupted sutures and a 4-0 monocryl subcuticular running suture.  The wound was cleaned, dried, and dressed with dermabond.    The patient was awakened from anesthesia and taken to the PACU in stable  condition.  Needle, sponge, and instrument counts were correct times 2.

## 2013-06-18 NOTE — Anesthesia Preprocedure Evaluation (Addendum)
Anesthesia Evaluation  Patient identified by MRN, date of birth, ID band Patient awake    Reviewed: Allergy & Precautions, H&P , NPO status , Patient's Chart, lab work & pertinent test results  Airway Mallampati: II      Dental   Pulmonary neg pulmonary ROS,          Cardiovascular negative cardio ROS  Rhythm:Regular Rate:Normal     Neuro/Psych    GI/Hepatic negative GI ROS, Neg liver ROS,   Endo/Other  negative endocrine ROS  Renal/GU negative Renal ROS     Musculoskeletal   Abdominal   Peds  Hematology negative hematology ROS (+)   Anesthesia Other Findings   Reproductive/Obstetrics                           Anesthesia Physical Anesthesia Plan  ASA: II  Anesthesia Plan:    Post-op Pain Management:    Induction: Intravenous  Airway Management Planned: LMA  Additional Equipment:   Intra-op Plan:   Post-operative Plan: Extubation in OR  Informed Consent: I have reviewed the patients History and Physical, chart, labs and discussed the procedure including the risks, benefits and alternatives for the proposed anesthesia with the patient or authorized representative who has indicated his/her understanding and acceptance.     Plan Discussed with: CRNA, Anesthesiologist and Surgeon  Anesthesia Plan Comments:         Anesthesia Quick Evaluation

## 2013-06-18 NOTE — Interval H&P Note (Signed)
History and Physical Interval Note:  06/18/2013 11:42 AM  Emily Mcknight  has presented today for surgery, with the diagnosis of axillary mass  The various methods of treatment have been discussed with the patient and family. After consideration of risks, benefits and other options for treatment, the patient has consented to  Procedure(s): EXCISION AXILLARY MASS (Left) as a surgical intervention .  The patient's history has been reviewed, patient examined, no change in status, stable for surgery.  I have reviewed the patient's chart and labs.  Questions were answered to the patient's satisfaction.     Rogue Pautler

## 2013-06-18 NOTE — H&P (View-Only) (Signed)
Chief Complaint  Patient presents with  . eval breast mass    lt breast    HISTORY: Patient is a 58 year old female who presents with a left axillary mass for around a month. This was originally larger but has stayed the same size now for around 3 weeks. It is slightly tender. She denies any trauma to her arm. She has no medical problems. She has recently moved to [place] from [place] but has had all of her medical care [place]. She underwent mammogram and ultrasound in [facility]. I have the images available but the report. There is a mass on ultrasound in her axilla. She also has heterogeneously dense breast tissue.  Past Medical History  Diagnosis Date  . Headache(784.0)   . Morton's neuroma   . Endometrial polyp   . Osteopenia 12/2012    T score -1.7 FRAX 6%/0.6%    Past Surgical History  Procedure Laterality Date  . Hysteroscopy  2006 and 2009    polyp  . Cystectomy  1973    neck benign  . Foot neuroma surgery  2001    left    Current Outpatient Prescriptions  Medication Sig Dispense Refill  . Eletriptan Hydrobromide (RELPAX PO) Take by mouth.        . estradiol (ESTRACE VAGINAL) 0.1 MG/GM vaginal cream Place 8.52 Applicatorfuls vaginally daily.  127.5 g  3   No current facility-administered medications for this visit.     No Known Allergies   Family History  Problem Relation Age of Onset  . Diabetes Maternal Grandmother   . Cancer Maternal Grandmother     pancreatic cancer  . Liver cancer Paternal Grandmother   . Heart disease Paternal Grandfather      History   Social History  . Marital Status: Married    Spouse Name: N/A    Number of Children: N/A  . Years of Education: N/A   Social History Main Topics  . Smoking status: Never Smoker   . Smokeless tobacco: Never Used  . Alcohol Use: 1.5 oz/week    3 drink(s) per week     Comment: social  . Drug Use: No  . Sexual Activity: Yes    Partners: Male    Birth Control/ Protection:  Post-menopausal   Other Topics Concern  . None   Social History Narrative  . None     REVIEW OF SYSTEMS - PERTINENT POSITIVES ONLY: 12 point review of systems negative other than HPI and PMH except for headaches  EXAM: Filed Vitals:   06/17/13 1005  BP: 124/80  Pulse: 80  Temp: 97.6 F (36.4 C)  Resp: 16    Wt Readings from Last 3 Encounters:  06/17/13 128 lb (58.06 kg)  06/17/13 128 lb (58.06 kg)  01/02/13 118 lb (53.524 kg)     Gen:  No acute distress.  Well nourished and well groomed.   Neurological: Alert and oriented to person, place, and time. Coordination normal.  Head: Normocephalic and atraumatic.  Eyes: Conjunctivae are normal. Pupils are equal, round, and reactive to light. No scleral icterus.  Neck: Normal range of motion. Neck supple. No tracheal deviation or thyromegaly present.  Cardiovascular: Normal rate, regular rhythm, and intact distal pulses.   Breast: dense breast tissue bilaterally.  No palpable breast masses.  2 cm mass distal to axillary crease.   Respiratory: Effort normal.  No respiratory distress. No chest wall tenderness. GI: Soft. The abdomen is soft and nontender.  There is no rebound and no  guarding.  Musculoskeletal: Normal range of motion. Extremities are nontender.  Lymphadenopathy: No cervical, preauricular, postauricular or axillary adenopathy is present Skin: Skin is warm and dry. No rash noted. No diaphoresis. No erythema. No pallor. No clubbing, cyanosis, or edema.   Psychiatric: Normal mood and affect. Behavior is normal. Judgment and thought content normal.    LABORATORY RESULTS: Available labs are reviewed   No results found for this or any previous visit (from the past 2160 hour(s)).   RADIOLOGY RESULTS: [facility] is 1.4x2.2x1.2 cm in axilla, hypoechoic.       ASSESSMENT AND PLAN: left Axillary mass Pt appears to have lipoma vs LN in left axilla.   This is distal to axillary crease,  more in arm, and it is symptomatic. Will plan to excise it.    Reviewed risks of surgery including bleeding, infection, damage to adjacent structures, numbness, cancer diagnosis, need for additional procedures.  I offered ultrasound guided FNA, but given location of mass more in arm, I feel this is less likely to be cancer.        Milus Height MD Surgical Oncology, [facility]      Visit Diagnoses: 1. left Axillary mass     Primary Care Physician: PROVIDER NOT IN SYSTEM [facility]

## 2013-06-18 NOTE — Transfer of Care (Signed)
Immediate Anesthesia Transfer of Care Note  Patient: Emily Mcknight  Procedure(s) Performed: Procedure(s): EXCISION AXILLARY MASS (Left)  Patient Location: PACU  Anesthesia Type:General  Level of Consciousness: sedated  Airway & Oxygen Therapy: Patient Spontanous Breathing and Patient connected to face mask oxygen  Post-op Assessment: Report given to PACU RN and Post -op Vital signs reviewed and stable  Post vital signs: Reviewed and stable  Complications: No apparent anesthesia complications

## 2013-06-19 ENCOUNTER — Encounter (HOSPITAL_BASED_OUTPATIENT_CLINIC_OR_DEPARTMENT_OTHER): Payer: Self-pay | Admitting: General Surgery

## 2013-06-20 NOTE — Progress Notes (Signed)
Quick Note:  Please let patient know that pathology is benign. Schwannoma ______

## 2013-06-24 ENCOUNTER — Telehealth (INDEPENDENT_AMBULATORY_CARE_PROVIDER_SITE_OTHER): Payer: Self-pay

## 2013-06-24 ENCOUNTER — Encounter (INDEPENDENT_AMBULATORY_CARE_PROVIDER_SITE_OTHER): Payer: BC Managed Care – PPO | Admitting: General Surgery

## 2013-06-24 NOTE — Telephone Encounter (Signed)
The mass was directly on a nerve.  I agree with recommendation to take advil/hydrocodone as needed.  The discomfort/numbness can be from swelling or nerve stretch.    Please make sure she has follow up appt.

## 2013-06-24 NOTE — Telephone Encounter (Signed)
Pt given path result.

## 2013-06-24 NOTE — Telephone Encounter (Signed)
Pts spouse called stating pt c/o some discomfort from axilla down to hand. No redness. No fever. Minimal swelling or bruising at axillary incision. Pain is a dull ache. Slight numbness. Pt is only taking advil. Pt does get more relief with hydrocodone but is not wanting to take a lot of narcotic meds. Pt does not have any abn reaction from narcotic meds she is just very cautious in taking these meds.  I advised pt that if her pain med helps pt needs to take at least half dose if this gives her relief. Pt to call if numbness or pain  increases or if arm/incision become red,swollen or inflammed. Pts spouse states he understands.

## 2013-06-24 NOTE — Telephone Encounter (Signed)
Pt's mass was directly on a nerve.  She needs to take  Hydrocodone/advil as needed.  It should run its course.

## 2013-07-01 ENCOUNTER — Telehealth (INDEPENDENT_AMBULATORY_CARE_PROVIDER_SITE_OTHER): Payer: Self-pay | Admitting: *Deleted

## 2013-07-01 NOTE — Telephone Encounter (Signed)
Husband called. Pt had sx 3.10.15.  Is having increased pain @ left axillary.  Also has swelling in the left hand.  Said pt is taking the hydrocodone/ibuprofen and pain is masked a little when she takes it.  She is complaining of worsening pain this morning. I suggested them to come in to urge office this afternoon, but pt lives in [place] and it was suggested they go into see their PCP.  Husband will call back if cannot get an appt with PCP.  Husband understands and is in agreeance.

## 2013-07-05 ENCOUNTER — Other Ambulatory Visit (INDEPENDENT_AMBULATORY_CARE_PROVIDER_SITE_OTHER): Payer: Self-pay | Admitting: General Surgery

## 2013-07-05 ENCOUNTER — Telehealth (INDEPENDENT_AMBULATORY_CARE_PROVIDER_SITE_OTHER): Payer: Self-pay | Admitting: General Surgery

## 2013-07-05 MED ORDER — GABAPENTIN 300 MG PO CAPS: 300.0000 mg | ORAL_CAPSULE | Freq: Every day | ORAL | Status: DC

## 2013-07-05 NOTE — Telephone Encounter (Signed)
Relayed message below from Dr. Barry Dienes.

## 2013-07-05 NOTE — Telephone Encounter (Signed)
Pt's husband called to address on-going pain and hand swelling from recent surgery on axillary mass.  They have relocated to the [place], [place], area, so sought evaluation from their new PCP.  Ultrasound done and was negative for hematoma or other fluid collection.  Her follow up from surgery is not until 07/19/13.  Any suggestions for her would be helpful.  Please advise.

## 2013-07-05 NOTE — Telephone Encounter (Signed)
I recommend neurontin, elevating arm, and PT consult for lymphedema.    Would do 300 mg neurontin at night daily.  #30, no refills.

## 2013-07-08 NOTE — Telephone Encounter (Signed)
Dr Cristal Deer at [facility] received request for PT referral. His nurse called  stating they will handle the referral of pt to PT for lymphedema. Their call back # is [phone] if there are any questions.

## 2013-07-19 ENCOUNTER — Encounter (INDEPENDENT_AMBULATORY_CARE_PROVIDER_SITE_OTHER): Payer: Self-pay | Admitting: General Surgery

## 2013-07-19 ENCOUNTER — Ambulatory Visit (INDEPENDENT_AMBULATORY_CARE_PROVIDER_SITE_OTHER): Payer: BC Managed Care – PPO | Admitting: General Surgery

## 2013-07-19 ENCOUNTER — Encounter (INDEPENDENT_AMBULATORY_CARE_PROVIDER_SITE_OTHER): Payer: BC Managed Care – PPO | Admitting: General Surgery

## 2013-07-19 VITALS — BP 104/68 | HR 88 | Temp 98.2°F | Resp 16 | Ht 70.0 in | Wt 129.4 lb

## 2013-07-19 DIAGNOSIS — D3610 Benign neoplasm of peripheral nerves and autonomic nervous system, unspecified: Secondary | ICD-10-CM

## 2013-07-19 DIAGNOSIS — D219 Benign neoplasm of connective and other soft tissue, unspecified: Secondary | ICD-10-CM

## 2013-07-19 NOTE — Patient Instructions (Signed)
Continue therapy.  See neurologist.  Follow up in 6-8 weeks.

## 2013-07-19 NOTE — Progress Notes (Signed)
HISTORY: Patient is status post excision of left axillary mass. This was found to be a malignant schwannoma. This was taken off what appeared to be a tendon that may have actually been one of her nerves. This was definitely not divided. She did well for around 4 days and then started having burning pain in her hand and weakness in her hand. She saw her PCP in [place] and he sent her to therapy. She has been taking oxycodone at night and has recently been started on nortriptyline. She is seeing PT back next week.    EXAM: General:  Alert and oriented. Incision:  Some swelling, some tightness in axilla.  Left hand with inability to extend index finger.  Weakness with abduction of fingers.     PATHOLOGY: Diagnosis Soft tissue mass, simple excision, Left axillary - CONSISTENT WITH DEGENERATIVE SCHWANNOMA. - SEE COMMENT   ASSESSMENT AND PLAN:   Degenerative schwannoma left axilla I recommend continuing OT and PT for her weakness and numbness in her left hand.  I would anticipate that this will improve as there were no structures divided and the axilla.  I also do recommend that she see a neurologist. 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  I will see her back in around 6-8 weeks. Advised her to call if she needs to be seen earlier.  I created with her primary care physician's plan. He started her on nortriptyline and is planning on switching to Neurontin if this is not successful in relieving her symptoms.      Milus Height, MD Surgical Oncology, [facility]   Dr. Cristal Deer, [facility].

## 2013-07-19 NOTE — Assessment & Plan Note (Signed)
I recommend continuing OT and PT for her weakness and numbness in her left hand.  I would anticipate that this will improve as there were no structures divided and the axilla.  I also do recommend that she see a neurologist. 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  I will see her back in around 6-8 weeks. Advised her to call if she needs to be seen earlier.  I created with her primary care physician's plan. He started her on nortriptyline and is planning on switching to Neurontin if this is not successful in relieving her symptoms.

## 2013-07-30 ENCOUNTER — Encounter (INDEPENDENT_AMBULATORY_CARE_PROVIDER_SITE_OTHER): Payer: Self-pay

## 2013-09-20 ENCOUNTER — Ambulatory Visit (INDEPENDENT_AMBULATORY_CARE_PROVIDER_SITE_OTHER): Payer: BC Managed Care – PPO | Admitting: General Surgery

## 2013-09-23 ENCOUNTER — Ambulatory Visit (INDEPENDENT_AMBULATORY_CARE_PROVIDER_SITE_OTHER): Payer: BC Managed Care – PPO | Admitting: General Surgery

## 2014-02-10 ENCOUNTER — Encounter (INDEPENDENT_AMBULATORY_CARE_PROVIDER_SITE_OTHER): Payer: Self-pay | Admitting: General Surgery

## 2015-11-02 ENCOUNTER — Encounter: Payer: Self-pay | Admitting: Gastroenterology
